# Patient Record
Sex: Female | Born: 1970
Health system: Southern US, Community
[De-identification: ages and names within clinical notes are randomized; demographics above are authoritative.]

## PROBLEM LIST (undated history)

## (undated) DIAGNOSIS — M1990 Unspecified osteoarthritis, unspecified site: Secondary | ICD-10-CM

## (undated) DIAGNOSIS — B029 Zoster without complications: Secondary | ICD-10-CM

## (undated) DIAGNOSIS — I341 Nonrheumatic mitral (valve) prolapse: Secondary | ICD-10-CM

## (undated) DIAGNOSIS — F429 Obsessive-compulsive disorder, unspecified: Secondary | ICD-10-CM

## (undated) DIAGNOSIS — R011 Cardiac murmur, unspecified: Secondary | ICD-10-CM

## (undated) DIAGNOSIS — N3010 Interstitial cystitis (chronic) without hematuria: Secondary | ICD-10-CM

## (undated) HISTORY — DX: Obsessive-compulsive disorder, unspecified: F42.9

## (undated) HISTORY — DX: Interstitial cystitis (chronic) without hematuria: N30.10

## (undated) HISTORY — DX: Cardiac murmur, unspecified: R01.1

## (undated) HISTORY — DX: Zoster without complications: B02.9

## (undated) HISTORY — DX: Nonrheumatic mitral (valve) prolapse: I34.1

## (undated) HISTORY — DX: Unspecified osteoarthritis, unspecified site: M19.90

---
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
## 2001-09-28 ENCOUNTER — Other Ambulatory Visit: Admission: RE | Admit: 2001-09-28 | Discharge: 2001-09-28 | Payer: Self-pay | Admitting: Obstetrics and Gynecology

## 2002-01-06 HISTORY — PX: DILATION AND CURETTAGE OF UTERUS: SHX78

## 2002-04-20 ENCOUNTER — Inpatient Hospital Stay (HOSPITAL_COMMUNITY): Admission: AD | Admit: 2002-04-20 | Discharge: 2002-04-23 | Payer: Self-pay | Admitting: Obstetrics and Gynecology

## 2002-05-20 ENCOUNTER — Other Ambulatory Visit: Admission: RE | Admit: 2002-05-20 | Discharge: 2002-05-20 | Payer: Self-pay | Admitting: Obstetrics and Gynecology

## 2007-11-10 ENCOUNTER — Ambulatory Visit: Payer: Self-pay | Admitting: Urology

## 2014-11-28 ENCOUNTER — Encounter: Payer: Self-pay | Admitting: Urology

## 2014-11-28 ENCOUNTER — Ambulatory Visit (INDEPENDENT_AMBULATORY_CARE_PROVIDER_SITE_OTHER): Payer: BLUE CROSS/BLUE SHIELD | Admitting: Urology

## 2014-11-28 VITALS — BP 126/73 | HR 80 | Ht 63.0 in | Wt 131.2 lb

## 2014-11-28 DIAGNOSIS — R3915 Urgency of urination: Secondary | ICD-10-CM | POA: Insufficient documentation

## 2014-11-28 DIAGNOSIS — R300 Dysuria: Secondary | ICD-10-CM | POA: Diagnosis not present

## 2014-11-28 DIAGNOSIS — R350 Frequency of micturition: Secondary | ICD-10-CM | POA: Diagnosis not present

## 2014-11-28 LAB — URINALYSIS, COMPLETE
Bilirubin, UA: NEGATIVE
GLUCOSE, UA: NEGATIVE
Ketones, UA: NEGATIVE
Leukocytes, UA: NEGATIVE
NITRITE UA: NEGATIVE
PROTEIN UA: NEGATIVE
Specific Gravity, UA: 1.01 (ref 1.005–1.030)
Urobilinogen, Ur: 0.2 mg/dL (ref 0.2–1.0)
pH, UA: 7 (ref 5.0–7.5)

## 2014-11-28 LAB — MICROSCOPIC EXAMINATION
BACTERIA UA: NONE SEEN
RBC, UA: NONE SEEN /hpf (ref 0–?)

## 2014-11-28 LAB — BLADDER SCAN AMB NON-IMAGING

## 2014-11-28 MED ORDER — FESOTERODINE FUMARATE ER 4 MG PO TB24: 4.0000 mg | ORAL_TABLET | Freq: Every day | ORAL | Status: DC

## 2014-11-28 NOTE — Progress Notes (Signed)
11/28/2014 11:58 AM   Allison Lambert Jan 14, 1970 AS:[id number]  Referring provider: No referring provider defined for this encounter.  Chief Complaint  Patient presents with  . Urinary Frequency    New Patient    HPI:  1 - Episodic Dysuria, Likely Interstitial Cystitis - pt with few flairs of irritative symptoms with dysuria / urgency / frequency (up to 2X hourly). One episode lasting few most in 1990s, had eval with IVP, cysto, and told likely interstitial cystitis. Another flair around 11-Oct-2014 after very stressful death in family. UA / UCX negative. No help with empiric ABX. Some significant relief with AZO. Non-smoker. No gross hematuria. No new neurologic symptoms. Pelvic 11/2014 unremarkable. PVR 11/2014 "66mL".   PMH sig for MVP (asymptomatic). No blood thinners.   Today "Allison Lambert" is seen as new patient for above.   PMH: Past Medical History  Diagnosis Date  . Arthritis   . Mitral valve prolapse   . Heart murmur   . OCD (obsessive compulsive disorder)     Surgical History: Past Surgical History  Procedure Laterality Date  . Knee arthroscopy  1987  . Dilation and curettage of uterus  2004    Home Medications:    Medication List       This list is accurate as of: 11/28/14 11:58 AM.  Always use your most recent med list.               cetirizine 10 MG tablet  Commonly known as:  ZYRTEC  Take 10 mg by mouth daily.     omega-3 acid ethyl esters 1 G capsule  Commonly known as:  LOVAZA  Take by mouth 2 (two) times daily.     Vitamin D (Cholecalciferol) 1000 UNITS Caps  Take by mouth.        Allergies: No Known Allergies  Family History: Family History  Problem Relation Age of Onset  . Prostate cancer Father   . Bladder Cancer Neg Hx   . Kidney cancer Neg Hx     Social History:  reports that she has never smoked. She does not have any smokeless tobacco history on file. She reports that she does not drink alcohol or use illicit  drugs.  ROS: UROLOGY Frequent Urination?: Yes Hard to postpone urination?: No Burning/pain with urination?: Yes Get up at night to urinate?: Yes Leakage of urine?: Yes Urine stream starts and stops?: No Trouble starting stream?: No Do you have to strain to urinate?: No Blood in urine?: No Urinary tract infection?: No Sexually transmitted disease?: No Injury to kidneys or bladder?: No Painful intercourse?: Yes Weak stream?: No Currently pregnant?: No Vaginal bleeding?: No Last menstrual period?: 11-11-14  Gastrointestinal Nausea?: No Vomiting?: No Indigestion/heartburn?: Yes Diarrhea?: No Constipation?: No  Constitutional Fever: No Night sweats?: No Weight loss?: No Fatigue?: No  Skin Skin rash/lesions?: No Itching?: No  Eyes Blurred vision?: Yes Double vision?: No  Ears/Nose/Throat Sore throat?: No Sinus problems?: No  Hematologic/Lymphatic Swollen glands?: No Easy bruising?: No  Cardiovascular Leg swelling?: No Chest pain?: No  Respiratory Cough?: No Shortness of breath?: No  Endocrine Excessive thirst?: No  Musculoskeletal Back pain?: No Joint pain?: Yes  Neurological Headaches?: No Dizziness?: No  Psychologic Depression?: No Anxiety?: No  Physical Exam: BP 126/73 mmHg  Pulse 80  Ht 5\' 3"  (1.6 m)  Wt 131 lb 3.2 oz (59.512 kg)  BMI 23.25 kg/m2  LMP 11/11/2014  Constitutional:  Alert and oriented, No acute distress. HEENT: [place] AT, moist mucus membranes.  Trachea midline, no masses. Cardiovascular: No clubbing, cyanosis, or edema. Respiratory: Normal respiratory effort, no increased work of breathing. GI: Abdomen is soft, nontender, ondistended, no abdominal masses GU: No CVA tenderness. PELVIC: With Judson Roch as chaparone pelvic exam performed. No point tenderness. No palpable pelvic masses. No CMT. Bladder and urethra non-painful to palpation.  Skin: No rashes, bruises or suspicious lesions. Lymph: No cervical or inguinal  adenopathy. Neurologic: Grossly intact, no focal deficits, moving all 4 extremities. Psychiatric: Normal mood and affect.  Laboratory Data: No results found for: WBC, HGB, HCT, MCV, PLT  No results found for: CREATININE  No results found for: PSA  No results found for: TESTOSTERONE  No results found for: HGBA1C  Urinalysis No results found for: COLORURINE, APPEARANCEUR, LABSPEC, PHURINE, GLUCOSEU, HGBUR, BILIRUBINUR, KETONESUR, PROTEINUR, UROBILINOGEN, NITRITE, LEUKOCYTESUR  Pertinent Imaging:   Assessment & Plan:    1 - Episodic Dysuria, Likely Interstitial Cystitis - history and exam very consistent with likely interstitial cystitis, mild. Rec symptomatic treatment with anticholinergic and prn AZO and this will likely resolve as it has before when stressors are less. Strongly warned re-evaluation necessary if any fevers, blood in urine, or acute flank pain. Samples and RX for Toviaz 4mg . Warned about dry mouth / constipation.   2 - Offered f/u 3 mos v. Prn and she opts for prn. At this point I feel this is reasonable. She will call if symptoms do not improve or if above symptoms develop.    No Follow-up on file.  Alexis Frock, [facility] [address] [place], [place] [address] [phone]

## 2014-12-05 ENCOUNTER — Telehealth: Payer: Self-pay

## 2014-12-05 MED ORDER — MIRABEGRON ER 50 MG PO TB24: 50.0000 mg | ORAL_TABLET | Freq: Every day | ORAL | Status: DC

## 2014-12-05 NOTE — Telephone Encounter (Signed)
Yes that sounds fine.  Will send script to pharmacy. Please let patient know.   Hollice Espy, MD

## 2014-12-05 NOTE — Telephone Encounter (Signed)
Pt called stating she was needing a PA done for her Allison Lambert, but her pharmacist suggested myrbetriq. Pt requested to try myrbetriq. Please advise.

## 2014-12-05 NOTE — Telephone Encounter (Signed)
Spoke with pt and made aware medication has been called into pharmacy. Pt voiced understanding.

## 2014-12-18 ENCOUNTER — Telehealth: Payer: Self-pay | Admitting: Urology

## 2014-12-18 NOTE — Telephone Encounter (Signed)
Spoke with pt in reference to medication. Pt would prefer to take Toviaz. Pt is going to have pharmacy send over another PA for [place].

## 2014-12-18 NOTE — Telephone Encounter (Signed)
Pt called, stated that she tried the Myrbetriq but it didn't seem to work for her.  She tried the [place] first and it had worked but then because she tried the [employer] but she didn't have as much luck with that one.  Pt wanted to move forward witht he authorization of the Toviaz to fill the prescription.  Pt asked if the nurse could go ahead and do this.  Please call patient to let her know when the authorization has been completed at [phone].

## 2015-07-24 ENCOUNTER — Other Ambulatory Visit: Payer: Self-pay | Admitting: Obstetrics and Gynecology

## 2015-07-24 DIAGNOSIS — Z1231 Encounter for screening mammogram for malignant neoplasm of breast: Secondary | ICD-10-CM

## 2015-08-21 ENCOUNTER — Ambulatory Visit
Admission: RE | Admit: 2015-08-21 | Discharge: 2015-08-21 | Disposition: A | Discharge status: Home / Self Care | Payer: BLUE CROSS/BLUE SHIELD | Source: Ambulatory Visit | Attending: Obstetrics and Gynecology | Admitting: Obstetrics and Gynecology

## 2015-08-21 ENCOUNTER — Other Ambulatory Visit: Payer: Self-pay | Admitting: Obstetrics and Gynecology

## 2015-08-21 DIAGNOSIS — Z1231 Encounter for screening mammogram for malignant neoplasm of breast: Secondary | ICD-10-CM

## 2015-08-23 DIAGNOSIS — Z0000 Encounter for general adult medical examination without abnormal findings: Secondary | ICD-10-CM | POA: Diagnosis not present

## 2015-08-23 DIAGNOSIS — Z124 Encounter for screening for malignant neoplasm of cervix: Secondary | ICD-10-CM | POA: Diagnosis not present

## 2015-08-23 DIAGNOSIS — Z1151 Encounter for screening for human papillomavirus (HPV): Secondary | ICD-10-CM | POA: Diagnosis not present

## 2015-08-23 DIAGNOSIS — Z01419 Encounter for gynecological examination (general) (routine) without abnormal findings: Secondary | ICD-10-CM | POA: Diagnosis not present

## 2015-08-23 DIAGNOSIS — Z3009 Encounter for other general counseling and advice on contraception: Secondary | ICD-10-CM | POA: Diagnosis not present

## 2016-07-15 ENCOUNTER — Other Ambulatory Visit: Payer: Self-pay | Admitting: Obstetrics and Gynecology

## 2016-07-15 DIAGNOSIS — Z1231 Encounter for screening mammogram for malignant neoplasm of breast: Secondary | ICD-10-CM

## 2016-08-28 ENCOUNTER — Ambulatory Visit
Admission: RE | Admit: 2016-08-28 | Discharge: 2016-08-28 | Disposition: A | Discharge status: Home / Self Care | Payer: BLUE CROSS/BLUE SHIELD | Source: Ambulatory Visit | Attending: Obstetrics and Gynecology | Admitting: Obstetrics and Gynecology

## 2016-08-28 ENCOUNTER — Encounter: Payer: Self-pay | Admitting: Obstetrics and Gynecology

## 2016-08-28 DIAGNOSIS — Z1231 Encounter for screening mammogram for malignant neoplasm of breast: Secondary | ICD-10-CM | POA: Insufficient documentation

## 2016-09-16 ENCOUNTER — Ambulatory Visit (INDEPENDENT_AMBULATORY_CARE_PROVIDER_SITE_OTHER): Payer: BLUE CROSS/BLUE SHIELD | Admitting: Obstetrics and Gynecology

## 2016-09-16 ENCOUNTER — Encounter: Payer: Self-pay | Admitting: Obstetrics and Gynecology

## 2016-09-16 VITALS — BP 100/60 | HR 66 | Ht 63.0 in | Wt 122.0 lb

## 2016-09-16 DIAGNOSIS — Z0000 Encounter for general adult medical examination without abnormal findings: Secondary | ICD-10-CM | POA: Diagnosis not present

## 2016-09-16 DIAGNOSIS — Z01419 Encounter for gynecological examination (general) (routine) without abnormal findings: Secondary | ICD-10-CM

## 2016-09-16 DIAGNOSIS — Z131 Encounter for screening for diabetes mellitus: Secondary | ICD-10-CM

## 2016-09-16 DIAGNOSIS — Z13220 Encounter for screening for lipoid disorders: Secondary | ICD-10-CM

## 2016-09-16 DIAGNOSIS — Z1231 Encounter for screening mammogram for malignant neoplasm of breast: Secondary | ICD-10-CM | POA: Diagnosis not present

## 2016-09-16 DIAGNOSIS — Z1239 Encounter for other screening for malignant neoplasm of breast: Secondary | ICD-10-CM

## 2016-09-16 MED ORDER — TETANUS-DIPHTH-ACELL PERTUSSIS 5-2.5-18.5 LF-MCG/0.5 IM SUSP
0.5000 mL | Freq: Once | INTRAMUSCULAR | Status: AC
  Administered 2016-09-16: 0.5 mL via INTRAMUSCULAR

## 2016-09-16 NOTE — Progress Notes (Signed)
PCP:  Patient, No Pcp Per   Chief Complaint  Patient presents with  . Gynecologic Exam     HPI:      [name] is a 46 y.o. [id number] who LMP was Patient's last menstrual period was 08/21/2016., presents today for her annual examination.  Her menses are regular every 28-30 days, lasting 4 days.  Dysmenorrhea mild, occurring first 1-2 days of flow. She does not have intermenstrual bleeding.  Sex activity: single partner, contraception - condoms most of the time and rhythm method.  Last Pap: August 23, 2015  Results were: no abnormalities /neg HPV DNA  Hx of STDs: none  Last mammogram: 08/28/16  Results were: normal--routine follow-up in 12 months There is no FH of breast cancer. There is no FH of ovarian cancer. The patient does do self-breast exams.  Tobacco use: The patient denies current or previous tobacco use. Alcohol use: social drinker No drug use.  Exercise: moderately active  She does get adequate calcium and Vitamin D in her diet.  She had borderline lipids last yr. Normal HgA1C last yr but pre-DM 2016.  She would like a TdAP since last done 10 yrs ago.  She has a hx of OA/frequent UTIs. She was seen by urology and given myrbetriq, but it wasn't covered on insurance. Pt is using pumpkin seed oil with sx control.    Past Medical History:  Diagnosis Date  . Arthritis   . Heart murmur   . Mitral valve prolapse   . OCD (obsessive compulsive disorder)     Past Surgical History:  Procedure Laterality Date  . DILATION AND CURETTAGE OF UTERUS  2004  . KNEE ARTHROSCOPY  1987    Family History  Problem Relation Age of Onset  . Prostate cancer Father   . Bladder Cancer Neg Hx   . Kidney cancer Neg Hx   . Breast cancer Neg Hx     Social History   Social History  . Marital status: Married    Spouse name: N/A  . Number of children: N/A  . Years of education: N/A   Occupational History  . Not on file.   Social History Main Topics  . Smoking  status: Never Smoker  . Smokeless tobacco: Never Used  . Alcohol use No  . Drug use: No  . Sexual activity: Yes    Birth control/ protection: Condom   Other Topics Concern  . Not on file   Social History Narrative  . No narrative on file    No outpatient prescriptions have been marked as taking for the 09/16/16 encounter (Office Visit) with Copland, [name], PA-C.     ROS:  Review of Systems  Constitutional: Negative for fatigue, fever and unexpected weight change.  Respiratory: Negative for cough, shortness of breath and wheezing.   Cardiovascular: Negative for chest pain, palpitations and leg swelling.  Gastrointestinal: Negative for blood in stool, constipation, diarrhea, nausea and vomiting.  Endocrine: Negative for cold intolerance, heat intolerance and polyuria.  Genitourinary: Positive for frequency. Negative for dyspareunia, dysuria, flank pain, genital sores, hematuria, menstrual problem, pelvic pain, urgency, vaginal bleeding, vaginal discharge and vaginal pain.  Musculoskeletal: Negative for back pain, joint swelling and myalgias.  Skin: Negative for rash.  Neurological: Negative for dizziness, syncope, light-headedness, numbness and headaches.  Hematological: Negative for adenopathy.  Psychiatric/Behavioral: Negative for agitation, confusion, sleep disturbance and suicidal ideas. The patient is not nervous/anxious.      Objective: BP 100/60   Pulse 66  Ht 5\' 3"  (1.6 m)   Wt 122 lb (55.3 kg)   LMP 08/21/2016   BMI 21.61 kg/m    Physical Exam  Constitutional: She is oriented to person, place, and time. She appears well-developed and well-nourished.  Genitourinary: Vagina normal and uterus normal. There is no rash or tenderness on the right labia. There is no rash or tenderness on the left labia. No erythema or tenderness in the vagina. No vaginal discharge found. Right adnexum does not display mass and does not display tenderness. Left adnexum does not  display mass and does not display tenderness. Cervix does not exhibit motion tenderness or polyp. Uterus is not enlarged or tender.  Neck: Normal range of motion. No thyromegaly present.  Cardiovascular: Normal rate, regular rhythm and normal heart sounds.   No murmur heard. Pulmonary/Chest: Effort normal and breath sounds normal. Right breast exhibits no mass, no nipple discharge, no skin change and no tenderness. Left breast exhibits no mass, no nipple discharge, no skin change and no tenderness.  Abdominal: Soft. There is no tenderness. There is no guarding.  Musculoskeletal: Normal range of motion.  Neurological: She is alert and oriented to person, place, and time. No cranial nerve deficit.  Psychiatric: She has a normal mood and affect. Her behavior is normal.  Vitals reviewed.   Assessment/Plan: Encounter for annual routine gynecological examination  Screening for breast cancer - Plan: MM DIGITAL SCREENING BILATERAL  Blood tests for routine general physical examination - Plan: Lipid panel, Comprehensive metabolic panel, Hemoglobin A1c  Screening cholesterol level - Plan: Lipid panel  Screening for diabetes mellitus - Plan: Hemoglobin A1c   GYN counsel breast self exam, mammography screening, adequate intake of calcium and vitamin D, diet and exercise     F/U  Return in about 1 year (around 09/16/2017).  Alicia B. Copland, PA-C 09/16/2016 8:58 AM

## 2016-09-16 NOTE — Addendum Note (Signed)
Addended by: Quintella Baton D on: 09/16/2016 09:29 AM   Modules accepted: Orders

## 2016-09-18 LAB — COMPREHENSIVE METABOLIC PANEL

## 2016-09-18 LAB — LIPID PANEL

## 2016-09-30 ENCOUNTER — Encounter: Payer: Self-pay | Admitting: Obstetrics and Gynecology

## 2016-10-09 ENCOUNTER — Other Ambulatory Visit: Payer: Self-pay | Admitting: Obstetrics and Gynecology

## 2016-10-09 ENCOUNTER — Other Ambulatory Visit: Payer: BLUE CROSS/BLUE SHIELD

## 2016-10-09 DIAGNOSIS — Z131 Encounter for screening for diabetes mellitus: Secondary | ICD-10-CM

## 2016-10-09 DIAGNOSIS — Z0000 Encounter for general adult medical examination without abnormal findings: Secondary | ICD-10-CM | POA: Diagnosis not present

## 2016-10-09 DIAGNOSIS — Z13220 Encounter for screening for lipoid disorders: Secondary | ICD-10-CM

## 2016-10-10 LAB — COMPREHENSIVE METABOLIC PANEL
A/G RATIO: 1.7 (ref 1.2–2.2)
ALBUMIN: 4.5 g/dL (ref 3.5–5.5)
ALT: 10 IU/L (ref 0–32)
AST: 17 IU/L (ref 0–40)
Alkaline Phosphatase: 49 IU/L (ref 39–117)
BUN/Creatinine Ratio: 24 — ABNORMAL HIGH (ref 9–23)
BUN: 17 mg/dL (ref 6–24)
Bilirubin Total: 0.4 mg/dL (ref 0.0–1.2)
CALCIUM: 9.4 mg/dL (ref 8.7–10.2)
CO2: 19 mmol/L — AB (ref 20–29)
CREATININE: 0.7 mg/dL (ref 0.57–1.00)
Chloride: 103 mmol/L (ref 96–106)
GFR, EST AFRICAN AMERICAN: 119 mL/min/{1.73_m2} (ref >59)
GFR, EST NON AFRICAN AMERICAN: 104 mL/min/{1.73_m2} (ref >59)
GLOBULIN, TOTAL: 2.6 g/dL (ref 1.5–4.5)
Glucose: 83 mg/dL (ref 65–99)
Potassium: 4.9 mmol/L (ref 3.5–5.2)
SODIUM: 139 mmol/L (ref 134–144)
TOTAL PROTEIN: 7.1 g/dL (ref 6.0–8.5)

## 2016-10-10 LAB — LIPID PANEL
CHOL/HDL RATIO: 4 ratio (ref 0.0–4.4)
Cholesterol, Total: 261 mg/dL — ABNORMAL HIGH (ref 100–199)
HDL: 66 mg/dL (ref >39)
LDL CALC: 184 mg/dL — AB (ref 0–99)
TRIGLYCERIDES: 57 mg/dL (ref 0–149)
VLDL Cholesterol Cal: 11 mg/dL (ref 5–40)

## 2016-10-10 LAB — HEMOGLOBIN A1C
Est. average glucose Bld gHb Est-mCnc: 103 mg/dL
HEMOGLOBIN A1C: 5.2 % (ref 4.8–5.6)

## 2016-10-13 ENCOUNTER — Telehealth: Payer: Self-pay | Admitting: Obstetrics and Gynecology

## 2016-10-13 DIAGNOSIS — E785 Hyperlipidemia, unspecified: Secondary | ICD-10-CM | POA: Insufficient documentation

## 2016-10-13 DIAGNOSIS — E782 Mixed hyperlipidemia: Secondary | ICD-10-CM

## 2016-10-13 MED ORDER — ATORVASTATIN CALCIUM 10 MG PO TABS: 10.0000 mg | ORAL_TABLET | Freq: Every day | ORAL | 2 refills | Status: DC

## 2016-10-13 NOTE — Telephone Encounter (Signed)
Pt aware of elevated lipids, increased from last yr. Has done diet/wt loss/exercise changes with worsening levels. No FH. Pt amenable to trying low dose statin. Rx lipitor 10 mg. Rechk labs in 3 months.

## 2016-12-23 ENCOUNTER — Other Ambulatory Visit: Payer: Self-pay | Admitting: Obstetrics and Gynecology

## 2017-01-08 ENCOUNTER — Other Ambulatory Visit: Payer: BLUE CROSS/BLUE SHIELD

## 2017-01-08 DIAGNOSIS — E782 Mixed hyperlipidemia: Secondary | ICD-10-CM | POA: Diagnosis not present

## 2017-01-09 LAB — COMPREHENSIVE METABOLIC PANEL
ALBUMIN: 4.4 g/dL (ref 3.5–5.5)
ALT: 16 IU/L (ref 0–32)
AST: 16 IU/L (ref 0–40)
Albumin/Globulin Ratio: 1.8 (ref 1.2–2.2)
Alkaline Phosphatase: 49 IU/L (ref 39–117)
BILIRUBIN TOTAL: 0.3 mg/dL (ref 0.0–1.2)
BUN / CREAT RATIO: 25 — AB (ref 9–23)
BUN: 16 mg/dL (ref 6–24)
CHLORIDE: 101 mmol/L (ref 96–106)
CO2: 22 mmol/L (ref 20–29)
CREATININE: 0.64 mg/dL (ref 0.57–1.00)
Calcium: 9.6 mg/dL (ref 8.7–10.2)
GFR calc non Af Amer: 107 mL/min/{1.73_m2} (ref >59)
GFR, EST AFRICAN AMERICAN: 123 mL/min/{1.73_m2} (ref >59)
GLUCOSE: 85 mg/dL (ref 65–99)
Globulin, Total: 2.4 g/dL (ref 1.5–4.5)
Potassium: 5.2 mmol/L (ref 3.5–5.2)
Sodium: 139 mmol/L (ref 134–144)
TOTAL PROTEIN: 6.8 g/dL (ref 6.0–8.5)

## 2017-01-09 LAB — LIPID PANEL
Chol/HDL Ratio: 2.3 ratio (ref 0.0–4.4)
Cholesterol, Total: 149 mg/dL (ref 100–199)
HDL: 66 mg/dL (ref >39)
LDL Calculated: 72 mg/dL (ref 0–99)
Triglycerides: 54 mg/dL (ref 0–149)
VLDL Cholesterol Cal: 11 mg/dL (ref 5–40)

## 2017-01-12 ENCOUNTER — Telehealth: Payer: Self-pay | Admitting: Obstetrics and Gynecology

## 2017-01-12 MED ORDER — ATORVASTATIN CALCIUM 10 MG PO TABS: 10.0000 mg | ORAL_TABLET | Freq: Every day | ORAL | 2 refills | Status: DC

## 2017-01-12 NOTE — Telephone Encounter (Signed)
Pt aware of improved lipids on lipitor 10 mg. No side effects. Pt wants to continue. Rx eRxd. Rechk at 9/19 annual.

## 2017-04-14 DIAGNOSIS — R21 Rash and other nonspecific skin eruption: Secondary | ICD-10-CM | POA: Diagnosis not present

## 2017-05-25 ENCOUNTER — Encounter: Payer: Self-pay | Admitting: Obstetrics and Gynecology

## 2017-06-05 ENCOUNTER — Encounter: Payer: Self-pay | Admitting: Obstetrics and Gynecology

## 2017-06-09 NOTE — Telephone Encounter (Signed)
Called and left voice mail for patient to call back to be schedule °

## 2017-06-10 ENCOUNTER — Ambulatory Visit (INDEPENDENT_AMBULATORY_CARE_PROVIDER_SITE_OTHER): Payer: BLUE CROSS/BLUE SHIELD | Admitting: Obstetrics and Gynecology

## 2017-06-10 ENCOUNTER — Encounter: Payer: Self-pay | Admitting: Obstetrics and Gynecology

## 2017-06-10 VITALS — BP 110/68 | HR 87 | Ht 63.0 in | Wt 109.0 lb

## 2017-06-10 DIAGNOSIS — N938 Other specified abnormal uterine and vaginal bleeding: Secondary | ICD-10-CM

## 2017-06-10 DIAGNOSIS — N921 Excessive and frequent menstruation with irregular cycle: Secondary | ICD-10-CM | POA: Diagnosis not present

## 2017-06-10 NOTE — Patient Instructions (Signed)
I value your feedback and entrusting us with your care. If you get a [facility] patient survey, I would appreciate you taking the time to let us know about your experience today. Thank you! 

## 2017-06-10 NOTE — Progress Notes (Signed)
Patient, No Pcp Per   Chief Complaint  Patient presents with  . Menstrual Problem    Periods are more frequent and heavier.     HPI:      [name] is a 47 y.o. [id number] who LMP was Patient's last menstrual period was 05/24/2017 (exact date)., presents today for irregular menses the past 3 cycles. Menses were Q26 days, lasting 4 days with 1 heavy day, minimal cramping. Cycles have become shorter the past 2 months and flow is longer (5-7 days) and heavier with increased clots. Having midcycle BTB this month. No change in cramping. No increased stress/reported wt changes (down 13# on our scales since 9/18 but pt was trying to lose wt and hasn't lost any more recently) or travel. Diagnosed with shingles 4/19.  She is sex active, uses NFP/condoms. No pain/bleeding with sex.    Past Medical History:  Diagnosis Date  . Arthritis   . Heart murmur   . Mitral valve prolapse   . OCD (obsessive compulsive disorder)   . Shingles     Past Surgical History:  Procedure Laterality Date  . DILATION AND CURETTAGE OF UTERUS  2004  . KNEE ARTHROSCOPY  1987    Family History  Problem Relation Age of Onset  . Prostate cancer Father   . Bladder Cancer Neg Hx   . Kidney cancer Neg Hx   . Breast cancer Neg Hx     Social History   Socioeconomic History  . Marital status: Married    Spouse name: Not on file  . Number of children: Not on file  . Years of education: Not on file  . Highest education level: Not on file  Occupational History  . Not on file  Social Needs  . Financial resource strain: Not on file  . Food insecurity:    Worry: Not on file    Inability: Not on file  . Transportation needs:    Medical: Not on file    Non-medical: Not on file  Tobacco Use  . Smoking status: Never Smoker  . Smokeless tobacco: Never Used  Substance and Sexual Activity  . Alcohol use: No    Alcohol/week: 0.0 oz  . Drug use: No  . Sexual activity: Yes    Birth control/protection:  Condom  Lifestyle  . Physical activity:    Days per week: Not on file    Minutes per session: Not on file  . Stress: Not on file  Relationships  . Social connections:    Talks on phone: Not on file    Gets together: Not on file    Attends religious service: Not on file    Active member of club or organization: Not on file    Attends meetings of clubs or organizations: Not on file    Relationship status: Not on file  . Intimate partner violence:    Fear of current or ex partner: Not on file    Emotionally abused: Not on file    Physically abused: Not on file    Forced sexual activity: Not on file  Other Topics Concern  . Not on file  Social History Narrative  . Not on file    Outpatient Medications Prior to Visit  Medication Sig Dispense Refill  . atorvastatin (LIPITOR) 10 MG tablet Take 1 tablet (10 mg total) by mouth daily. 90 tablet 2  . Calcium-Magnesium-Vitamin D (CALCIUM 1200+D3 PO)     . cetirizine (ZYRTEC) 10 MG tablet Take 10  mg by mouth daily.    . Magnesium 250 MG TABS     . omega-3 acid ethyl esters (LOVAZA) 1 G capsule Take by mouth 2 (two) times daily.    . Vitamin D, Cholecalciferol, 1000 UNITS CAPS Take by mouth.    . fesoterodine (TOVIAZ) 4 MG TB24 tablet Take 1 tablet (4 mg total) by mouth daily. As needed for urinary urgency / freqeuncy. 30 tablet 11  . mirabegron ER (MYRBETRIQ) 50 MG TB24 tablet Take 1 tablet (50 mg total) by mouth daily. 30 tablet 5   No facility-administered medications prior to visit.     ROS:  Review of Systems  Constitutional: Negative for fever.  Gastrointestinal: Negative for blood in stool, constipation, diarrhea, nausea and vomiting.  Genitourinary: Positive for menstrual problem. Negative for dyspareunia, dysuria, flank pain, frequency, hematuria, urgency, vaginal bleeding, vaginal discharge and vaginal pain.  Musculoskeletal: Negative for back pain.  Skin: Negative for rash.    OBJECTIVE:   Vitals:  BP 110/68   Pulse  87   Ht 5\' 3"  (1.6 m)   Wt 109 lb (49.4 kg)   LMP 05/24/2017 (Exact Date)   BMI 19.31 kg/m   Physical Exam  Constitutional: She is oriented to person, place, and time. Vital signs are normal. She appears well-developed.  Pulmonary/Chest: Effort normal.  Genitourinary: Vagina normal and uterus normal. There is no rash, tenderness or lesion on the right labia. There is no rash, tenderness or lesion on the left labia. Uterus is not enlarged and not tender. Cervix exhibits no motion tenderness. Right adnexum displays no mass and no tenderness. Left adnexum displays no mass and no tenderness. No erythema or tenderness in the vagina. No vaginal discharge found.  Musculoskeletal: Normal range of motion.  Neurological: She is alert and oriented to person, place, and time.  Psychiatric: She has a normal mood and affect. Her behavior is normal. Thought content normal.  Vitals reviewed.    Assessment/Plan: DUB (dysfunctional uterine bleeding) - CHeck labs. If WNL, will check u/s. Neg exam today.  - Plan: TSH, Prolactin, POCT urine pregnancy  Menorrhagia with irregular cycle - Plan: TSH, Prolactin    Return in about 1 day (around 06/11/2017) for labs.  Alicia B. Copland, PA-C 06/11/2017 2:49 PM

## 2017-06-11 ENCOUNTER — Other Ambulatory Visit: Payer: BLUE CROSS/BLUE SHIELD

## 2017-06-11 ENCOUNTER — Encounter: Payer: Self-pay | Admitting: Obstetrics and Gynecology

## 2017-06-11 DIAGNOSIS — N938 Other specified abnormal uterine and vaginal bleeding: Secondary | ICD-10-CM | POA: Diagnosis not present

## 2017-06-11 DIAGNOSIS — N921 Excessive and frequent menstruation with irregular cycle: Secondary | ICD-10-CM | POA: Diagnosis not present

## 2017-06-11 LAB — POCT URINE PREGNANCY: Preg Test, Ur: NEGATIVE

## 2017-06-12 LAB — TSH: TSH: 1.46 u[IU]/mL (ref 0.450–4.500)

## 2017-06-12 LAB — PROLACTIN: Prolactin: 24.6 ng/mL — ABNORMAL HIGH (ref 4.8–23.3)

## 2017-06-15 ENCOUNTER — Telehealth: Payer: Self-pay | Admitting: Obstetrics and Gynecology

## 2017-06-15 DIAGNOSIS — N938 Other specified abnormal uterine and vaginal bleeding: Secondary | ICD-10-CM

## 2017-06-15 NOTE — Telephone Encounter (Signed)
Pt's labs neg for DUB. RTO day 5-9 of cycle for GYN u/s. Pt's menses started 06/13/17 (20 days this month), regular flow/cramping. Will call with u/s results and dispo. If neg, can discuss [facility] options/IUD/watch and wait.

## 2017-06-17 ENCOUNTER — Ambulatory Visit (INDEPENDENT_AMBULATORY_CARE_PROVIDER_SITE_OTHER): Payer: BLUE CROSS/BLUE SHIELD

## 2017-06-17 DIAGNOSIS — N938 Other specified abnormal uterine and vaginal bleeding: Secondary | ICD-10-CM | POA: Diagnosis not present

## 2017-06-18 ENCOUNTER — Telehealth: Payer: Self-pay | Admitting: Obstetrics and Gynecology

## 2017-06-18 NOTE — Telephone Encounter (Signed)
Pt aware of neg GYN u/s results for DUB. EM=4.78 mm. Pt menses Q19-26 days. This period was 19 days but normal flow/length of bleeding. Had neg labs. Discussed BC options, ablation, vs follow expectantly. Pt wants to watch cycles a few more months. F/u at 9/19 annual/sooner prn.  Also has small LTO cyst vs paraovarian cyst. Rechk u/s at 9/19 annual.   Patient Name: Allison Lambert DOB: [birth year] MRN: [id number]  ULTRASOUND REPORT  Location: [facility]  Date of Service: 06/17/2017    Indications:AUB Findings:  The uterus is anteverted and measures 10.03x5.96x4.12cm. Echo texture is homogenous without evidence of focal masses. The Endometrium measures 4.78 mm.  Right Ovary measures 2,38x2.45x1.46 cm. It is normal in appearance. Left Ovary measures 2.96x2.44x2.41 cm. It is normal in appearance. Survey of the adnexa demonstrates  adnexal mass or a simple cyst = 1.87x1.31cm appears next to the Left ovary (possib. Of para-ovarian cyst) There is no free fluid in the cul de sac.  Impression: 1.  simple cyst = 1.87x1.31cm appears next to the Left ovary (possib. Of para-ovarian cyst)  Recommendations: 1.Clinical correlation with the patient's History and Physical Exam.  Ninfa Linden RDMS

## 2017-09-01 ENCOUNTER — Ambulatory Visit
Admission: RE | Admit: 2017-09-01 | Discharge: 2017-09-01 | Disposition: A | Discharge status: Home / Self Care | Payer: BLUE CROSS/BLUE SHIELD | Source: Ambulatory Visit | Attending: Obstetrics and Gynecology | Admitting: Obstetrics and Gynecology

## 2017-09-01 ENCOUNTER — Encounter: Payer: Self-pay | Admitting: Obstetrics and Gynecology

## 2017-09-01 DIAGNOSIS — Z1231 Encounter for screening mammogram for malignant neoplasm of breast: Secondary | ICD-10-CM | POA: Diagnosis not present

## 2017-09-01 DIAGNOSIS — Z1239 Encounter for other screening for malignant neoplasm of breast: Secondary | ICD-10-CM

## 2017-09-21 ENCOUNTER — Other Ambulatory Visit: Payer: Self-pay | Admitting: Obstetrics and Gynecology

## 2017-09-21 ENCOUNTER — Other Ambulatory Visit: Payer: BLUE CROSS/BLUE SHIELD

## 2017-09-21 ENCOUNTER — Ambulatory Visit: Payer: BLUE CROSS/BLUE SHIELD | Admitting: Obstetrics and Gynecology

## 2017-09-21 DIAGNOSIS — N83202 Unspecified ovarian cyst, left side: Secondary | ICD-10-CM

## 2017-09-22 ENCOUNTER — Encounter: Payer: Self-pay | Admitting: Obstetrics and Gynecology

## 2017-09-22 ENCOUNTER — Ambulatory Visit (INDEPENDENT_AMBULATORY_CARE_PROVIDER_SITE_OTHER): Payer: BLUE CROSS/BLUE SHIELD | Admitting: Obstetrics and Gynecology

## 2017-09-22 ENCOUNTER — Ambulatory Visit (INDEPENDENT_AMBULATORY_CARE_PROVIDER_SITE_OTHER): Payer: BLUE CROSS/BLUE SHIELD

## 2017-09-22 VITALS — BP 112/70 | HR 91 | Ht 63.0 in | Wt 110.0 lb

## 2017-09-22 DIAGNOSIS — Z01411 Encounter for gynecological examination (general) (routine) with abnormal findings: Secondary | ICD-10-CM | POA: Diagnosis not present

## 2017-09-22 DIAGNOSIS — Z1239 Encounter for other screening for malignant neoplasm of breast: Secondary | ICD-10-CM

## 2017-09-22 DIAGNOSIS — N83202 Unspecified ovarian cyst, left side: Secondary | ICD-10-CM | POA: Insufficient documentation

## 2017-09-22 DIAGNOSIS — E782 Mixed hyperlipidemia: Secondary | ICD-10-CM | POA: Diagnosis not present

## 2017-09-22 DIAGNOSIS — D251 Intramural leiomyoma of uterus: Secondary | ICD-10-CM

## 2017-09-22 DIAGNOSIS — Z0000 Encounter for general adult medical examination without abnormal findings: Secondary | ICD-10-CM

## 2017-09-22 DIAGNOSIS — Z1231 Encounter for screening mammogram for malignant neoplasm of breast: Secondary | ICD-10-CM | POA: Diagnosis not present

## 2017-09-22 DIAGNOSIS — N938 Other specified abnormal uterine and vaginal bleeding: Secondary | ICD-10-CM | POA: Diagnosis not present

## 2017-09-22 DIAGNOSIS — Z01419 Encounter for gynecological examination (general) (routine) without abnormal findings: Secondary | ICD-10-CM

## 2017-09-22 NOTE — Progress Notes (Signed)
PCP:  Patient, No Pcp Per   Chief Complaint  Patient presents with  . Gynecologic Exam    u/s today     HPI:      [name] is a 47 y.o. [id number] who LMP was Patient's last menstrual period was 09/14/2017 (exact date)., presents today for her annual examination.  Her menses are regular every 21-24 days (used to be Q26 days), lasting 4 days.  Dysmenorrhea mild, improved with NSAIDs. She does not have intermenstrual bleeding. DUB sx from 6/19 improved (cycles were closer to Q2 wks). Had neg eval with labs/u/s. Offered BC for cycle control but pt wanted to follow cycles. Doing ok so far.  Incidental LTO cyst vs paraovarian cyst noted on 6/19 u/s so repeat u/s done today.  Sex activity: single partner, contraception - condoms most of the time and rhythm method.  Last Pap: 08/23/15 Results were: no abnormalities /neg HPV DNA  Hx of STDs: none  Last mammogram: 09/01/17 Results were: normal--routine follow-up in 12 months There is no FH of breast cancer. There is no FH of ovarian cancer. The patient does do self-breast exams.  Tobacco use: The patient denies current or previous tobacco use. Alcohol use: social drinker No drug use.  Exercise: very active  She does get adequate calcium and Vitamin D in her diet.  She had elevated lipids 10/18 and was started on lipitor 10 mg daily. Normal lipids 1/19. Doing well with Rx. No side effects. Due for labs today. Normal HgA1C 10/18 but pre-DM 2016. Wt loss improved levels.   Past Medical History:  Diagnosis Date  . Arthritis   . Heart murmur   . Mitral valve prolapse   . OCD (obsessive compulsive disorder)   . Shingles     Past Surgical History:  Procedure Laterality Date  . DILATION AND CURETTAGE OF UTERUS  2004  . KNEE ARTHROSCOPY  1987    Family History  Problem Relation Age of Onset  . Prostate cancer Father 31  . Bladder Cancer Father 11       Malignant  . Alzheimer's disease Maternal Grandmother   . Diabetes  Mellitus II Maternal Grandmother   . Heart disease Maternal Grandmother 74  . Other Maternal Grandmother        Myocardial infarction old/healed  . Diabetes Mellitus II Maternal Grandfather   . Prostate cancer Paternal Grandfather 33  . Kidney cancer Neg Hx   . Breast cancer Neg Hx     Social History   Socioeconomic History  . Marital status: Married    Spouse name: Not on file  . Number of children: Not on file  . Years of education: Not on file  . Highest education level: Not on file  Occupational History  . Not on file  Social Needs  . Financial resource strain: Not on file  . Food insecurity:    Worry: Not on file    Inability: Not on file  . Transportation needs:    Medical: Not on file    Non-medical: Not on file  Tobacco Use  . Smoking status: Never Smoker  . Smokeless tobacco: Never Used  Substance and Sexual Activity  . Alcohol use: No    Alcohol/week: 0.0 standard drinks  . Drug use: No  . Sexual activity: Yes    Birth control/protection: Condom  Lifestyle  . Physical activity:    Days per week: Not on file    Minutes per session: Not on file  . Stress:  Not on file  Relationships  . Social connections:    Talks on phone: Not on file    Gets together: Not on file    Attends religious service: Not on file    Active member of club or organization: Not on file    Attends meetings of clubs or organizations: Not on file    Relationship status: Not on file  . Intimate partner violence:    Fear of current or ex partner: Not on file    Emotionally abused: Not on file    Physically abused: Not on file    Forced sexual activity: Not on file  Other Topics Concern  . Not on file  Social History Narrative  . Not on file    Current Meds  Medication Sig  . atorvastatin (LIPITOR) 10 MG tablet Take 1 tablet (10 mg total) by mouth daily.  . Calcium-Magnesium-Vitamin D (CALCIUM 1200+D3 PO)   . cetirizine (ZYRTEC) 10 MG tablet Take 10 mg by mouth daily.  .  Magnesium 250 MG TABS   . omega-3 acid ethyl esters (LOVAZA) 1 G capsule Take by mouth 2 (two) times daily.  . Vitamin D, Cholecalciferol, 1000 UNITS CAPS Take by mouth.     ROS:  Review of Systems  Constitutional: Negative for fatigue, fever and unexpected weight change.  Respiratory: Negative for cough, shortness of breath and wheezing.   Cardiovascular: Negative for chest pain, palpitations and leg swelling.  Gastrointestinal: Negative for blood in stool, constipation, diarrhea, nausea and vomiting.  Endocrine: Negative for cold intolerance, heat intolerance and polyuria.  Genitourinary: Positive for frequency. Negative for dyspareunia, dysuria, flank pain, genital sores, hematuria, menstrual problem, pelvic pain, urgency, vaginal bleeding, vaginal discharge and vaginal pain.  Musculoskeletal: Negative for back pain, joint swelling and myalgias.  Skin: Negative for rash.  Neurological: Negative for dizziness, syncope, light-headedness, numbness and headaches.  Hematological: Negative for adenopathy.  Psychiatric/Behavioral: Negative for agitation, confusion, sleep disturbance and suicidal ideas. The patient is not nervous/anxious.      Objective: BP 112/70   Pulse 91   Ht 5\' 3"  (1.6 m)   Wt 110 lb (49.9 kg)   LMP 09/14/2017 (Exact Date)   BMI 19.49 kg/m    Physical Exam  Constitutional: She is oriented to person, place, and time. She appears well-developed and well-nourished.  Genitourinary: Vagina normal and uterus normal. There is no rash or tenderness on the right labia. There is no rash or tenderness on the left labia. No erythema or tenderness in the vagina. No vaginal discharge found. Right adnexum does not display mass and does not display tenderness. Left adnexum does not display mass and does not display tenderness. Cervix does not exhibit motion tenderness or polyp. Uterus is not enlarged or tender.  Neck: Normal range of motion. No thyromegaly present.    Cardiovascular: Normal rate, regular rhythm and normal heart sounds.  No murmur heard. Pulmonary/Chest: Effort normal and breath sounds normal. Right breast exhibits no mass, no nipple discharge, no skin change and no tenderness. Left breast exhibits no mass, no nipple discharge, no skin change and no tenderness.  Abdominal: Soft. There is no tenderness. There is no guarding.  Musculoskeletal: Normal range of motion.  Neurological: She is alert and oriented to person, place, and time. No cranial nerve deficit.  Psychiatric: She has a normal mood and affect. Her behavior is normal.  Vitals reviewed.  RESULTS:  Patient Name: Allison Lambert DOB: 01/16/1970 MRN: [id number]  ULTRASOUND REPORT  Location: [facility]  Date of Service: 09/22/2017    Indications:Left adnexal cyst (para-ovarian) Findings:  The uterus is anteverted and measures 8.96x6.26x4.12cm. Echo texture is heterogenous with evidence of focal masses. Within the uterus there is  suspected fibroid measuring: Fibroid 1:IM, in posterior uterine wall = 11.18x7.30mm  The Endometrium measures 7.84 mm.  Right Ovary measures 2.41x2.05x1.48 cm. It is normal in appearance. Left Ovary measures 3.72x2.43x1.46 cm. It is normal in appearance. Survey of the adnexa demonstrates no adnexal masses. There is no free fluid in the cul de sac.  Impression: 1. Within the uterus there is  suspected fibroid measuring: Fibroid 1:IM, in posterior uterine wall = 11.18x7.108mm 2. No Left adnexal cyst (para-ovarian) is seen    Recommendations: 1.Clinical correlation with the patient's History and Physical Exam.  Ninfa Linden, RDMS   Assessment/Plan: Encounter for annual routine gynecological examination  Screening for breast cancer - Pt current on mammo.  Blood tests for routine general physical examination - Plan: Comprehensive metabolic panel, Lipid panel  Mixed hyperlipidemia - Check labs. Will RF lipitor based on  levels. - Plan: Comprehensive metabolic panel, Lipid panel  DUB (dysfunctional uterine bleeding) - Had neg eval. Sx improved. F/u prn.  Cyst of left ovary - Resolved on u/s today. F/u prn.    GYN counsel breast self exam, mammography screening, adequate intake of calcium and vitamin D, diet and exercise     F/U  Return in about 1 year (around 09/23/2018).  Alicia B. Copland, PA-C 09/22/2017 9:37 AM

## 2017-09-22 NOTE — Patient Instructions (Signed)
I value your feedback and entrusting us with your care. If you get a [facility] patient survey, I would appreciate you taking the time to let us know about your experience today. Thank you! 

## 2017-09-23 ENCOUNTER — Other Ambulatory Visit: Payer: Self-pay | Admitting: Obstetrics and Gynecology

## 2017-09-23 ENCOUNTER — Encounter: Payer: Self-pay | Admitting: Obstetrics and Gynecology

## 2017-09-23 LAB — COMPREHENSIVE METABOLIC PANEL
ALBUMIN: 4.5 g/dL (ref 3.5–5.5)
ALT: 16 IU/L (ref 0–32)
AST: 20 IU/L (ref 0–40)
Albumin/Globulin Ratio: 1.9 (ref 1.2–2.2)
Alkaline Phosphatase: 50 IU/L (ref 39–117)
BILIRUBIN TOTAL: 0.4 mg/dL (ref 0.0–1.2)
BUN / CREAT RATIO: 32 — AB (ref 9–23)
BUN: 20 mg/dL (ref 6–24)
CO2: 24 mmol/L (ref 20–29)
CREATININE: 0.63 mg/dL (ref 0.57–1.00)
Calcium: 9.8 mg/dL (ref 8.7–10.2)
Chloride: 101 mmol/L (ref 96–106)
GFR calc Af Amer: 123 mL/min/{1.73_m2} (ref >59)
GFR calc non Af Amer: 106 mL/min/{1.73_m2} (ref >59)
GLOBULIN, TOTAL: 2.4 g/dL (ref 1.5–4.5)
Glucose: 90 mg/dL (ref 65–99)
Potassium: 4.9 mmol/L (ref 3.5–5.2)
SODIUM: 141 mmol/L (ref 134–144)
Total Protein: 6.9 g/dL (ref 6.0–8.5)

## 2017-09-23 LAB — LIPID PANEL
Chol/HDL Ratio: 2.6 ratio (ref 0.0–4.4)
Cholesterol, Total: 174 mg/dL (ref 100–199)
HDL: 68 mg/dL (ref >39)
LDL CALC: 93 mg/dL (ref 0–99)
Triglycerides: 65 mg/dL (ref 0–149)
VLDL Cholesterol Cal: 13 mg/dL (ref 5–40)

## 2017-09-23 MED ORDER — ATORVASTATIN CALCIUM 10 MG PO TABS: 10.0000 mg | ORAL_TABLET | Freq: Every day | ORAL | 3 refills | Status: DC

## 2017-09-23 NOTE — Progress Notes (Signed)
Rx RF lipitor for 1 yr. Rechk labs 1 yr

## 2018-07-19 ENCOUNTER — Other Ambulatory Visit: Payer: Self-pay | Admitting: Obstetrics and Gynecology

## 2018-07-19 DIAGNOSIS — Z1231 Encounter for screening mammogram for malignant neoplasm of breast: Secondary | ICD-10-CM

## 2018-09-03 ENCOUNTER — Ambulatory Visit
Admission: RE | Admit: 2018-09-03 | Discharge: 2018-09-03 | Disposition: A | Discharge status: Home / Self Care | Payer: BC Managed Care – PPO | Source: Ambulatory Visit | Attending: Obstetrics and Gynecology | Admitting: Obstetrics and Gynecology

## 2018-09-03 DIAGNOSIS — Z1231 Encounter for screening mammogram for malignant neoplasm of breast: Secondary | ICD-10-CM | POA: Insufficient documentation

## 2018-09-06 ENCOUNTER — Other Ambulatory Visit: Payer: Self-pay | Admitting: Obstetrics and Gynecology

## 2018-09-06 DIAGNOSIS — N6489 Other specified disorders of breast: Secondary | ICD-10-CM

## 2018-09-06 DIAGNOSIS — R928 Other abnormal and inconclusive findings on diagnostic imaging of breast: Secondary | ICD-10-CM

## 2018-09-12 ENCOUNTER — Other Ambulatory Visit: Payer: Self-pay | Admitting: Obstetrics and Gynecology

## 2018-09-14 NOTE — Telephone Encounter (Signed)
Please advise, annual 9/22.

## 2018-09-16 ENCOUNTER — Ambulatory Visit
Admission: RE | Admit: 2018-09-16 | Discharge: 2018-09-16 | Disposition: A | Discharge status: Home / Self Care | Payer: BC Managed Care – PPO | Source: Ambulatory Visit | Attending: Obstetrics and Gynecology | Admitting: Obstetrics and Gynecology

## 2018-09-16 ENCOUNTER — Encounter: Payer: Self-pay | Admitting: Obstetrics and Gynecology

## 2018-09-16 DIAGNOSIS — R928 Other abnormal and inconclusive findings on diagnostic imaging of breast: Secondary | ICD-10-CM | POA: Diagnosis not present

## 2018-09-16 DIAGNOSIS — N6489 Other specified disorders of breast: Secondary | ICD-10-CM

## 2018-09-16 DIAGNOSIS — R922 Inconclusive mammogram: Secondary | ICD-10-CM | POA: Diagnosis not present

## 2018-09-28 ENCOUNTER — Ambulatory Visit (INDEPENDENT_AMBULATORY_CARE_PROVIDER_SITE_OTHER): Payer: BC Managed Care – PPO | Admitting: Obstetrics and Gynecology

## 2018-09-28 ENCOUNTER — Other Ambulatory Visit: Payer: Self-pay

## 2018-09-28 ENCOUNTER — Encounter: Payer: Self-pay | Admitting: Obstetrics and Gynecology

## 2018-09-28 VITALS — BP 120/72 | Ht 63.0 in | Wt 113.0 lb

## 2018-09-28 DIAGNOSIS — Z23 Encounter for immunization: Secondary | ICD-10-CM

## 2018-09-28 DIAGNOSIS — Z0000 Encounter for general adult medical examination without abnormal findings: Secondary | ICD-10-CM

## 2018-09-28 DIAGNOSIS — E782 Mixed hyperlipidemia: Secondary | ICD-10-CM

## 2018-09-28 DIAGNOSIS — Z01419 Encounter for gynecological examination (general) (routine) without abnormal findings: Secondary | ICD-10-CM

## 2018-09-28 DIAGNOSIS — Z1239 Encounter for other screening for malignant neoplasm of breast: Secondary | ICD-10-CM

## 2018-09-28 NOTE — Patient Instructions (Signed)
I value your feedback and entrusting us with your care. If you get a [facility] patient survey, I would appreciate you taking the time to let us know about your experience today. Thank you! 

## 2018-09-28 NOTE — Progress Notes (Signed)
PCP:  Patient, No Pcp Per   Chief Complaint  Patient presents with  . Gynecologic Exam  . Immunizations    flu shot today     HPI:      [name] is a 48 y.o. [id number] who LMP was Patient's last menstrual period was 09/21/2018 (exact date)., presents today for her annual examination.  Her menses are regular every 24-25 days, lasting 4 days. Has n/v and hot flashes first day of menses, but had similar sx in adolescence. No vasomotor sx otherwise. Dysmenorrhea mild, improved with NSAIDs. She does not have intermenstrual bleeding. DUB sx from 6/19 improved (cycles were closer to Q2 wks). Had neg eval with labs/u/s.  Sex activity: single partner, contraception - condoms most of the time and rhythm method.  Last Pap: 08/23/15 Results were: no abnormalities /neg HPV DNA  Hx of STDs: none  Last mammogram: 09/16/18 Results were: normal--routine follow-up in 12 months There is no FH of breast cancer. There is no FH of ovarian cancer. The patient does do self-breast exams.  Tobacco use: The patient denies current or previous tobacco use. Alcohol use: social drinker No drug use.  Exercise: very active  She does get adequate calcium and Vitamin D in her diet.  She had elevated lipids 10/18 and was started on lipitor 10 mg daily.  No side effects. Due for labs. Normal HgA1C 10/18 but pre-DM 2016. Wt loss improved levels.  Having issues with tennis elbow.   Past Medical History:  Diagnosis Date  . Arthritis   . Heart murmur   . Mitral valve prolapse   . OCD (obsessive compulsive disorder)   . Shingles     Past Surgical History:  Procedure Laterality Date  . DILATION AND CURETTAGE OF UTERUS  2004  . KNEE ARTHROSCOPY  1987    Family History  Problem Relation Age of Onset  . Prostate cancer Father 60  . Bladder Cancer Father 49       Malignant  . Alzheimer's disease Maternal Grandmother   . Diabetes Mellitus II Maternal Grandmother   . Heart disease Maternal  Grandmother 74  . Other Maternal Grandmother        Myocardial infarction old/healed  . Diabetes Mellitus II Maternal Grandfather   . Prostate cancer Paternal Grandfather 87  . Kidney cancer Neg Hx   . Breast cancer Neg Hx     Social History   Socioeconomic History  . Marital status: Married    Spouse name: Not on file  . Number of children: Not on file  . Years of education: Not on file  . Highest education level: Not on file  Occupational History  . Not on file  Social Needs  . Financial resource strain: Not on file  . Food insecurity    Worry: Not on file    Inability: Not on file  . Transportation needs    Medical: Not on file    Non-medical: Not on file  Tobacco Use  . Smoking status: Never Smoker  . Smokeless tobacco: Never Used  Substance and Sexual Activity  . Alcohol use: No    Alcohol/week: 0.0 standard drinks  . Drug use: No  . Sexual activity: Yes    Birth control/protection: Condom  Lifestyle  . Physical activity    Days per week: Not on file    Minutes per session: Not on file  . Stress: Not on file  Relationships  . Social Herbalist on  phone: Not on file    Gets together: Not on file    Attends religious service: Not on file    Active member of club or organization: Not on file    Attends meetings of clubs or organizations: Not on file    Relationship status: Not on file  . Intimate partner violence    Fear of current or ex partner: Not on file    Emotionally abused: Not on file    Physically abused: Not on file    Forced sexual activity: Not on file  Other Topics Concern  . Not on file  Social History Narrative  . Not on file    Current Meds  Medication Sig  . atorvastatin (LIPITOR) 10 MG tablet TAKE 1 TABLET BY MOUTH EVERY DAY  . Calcium-Magnesium-Vitamin D (CALCIUM 1200+D3 PO)   . cetirizine (ZYRTEC) 10 MG tablet Take 10 mg by mouth daily.  . Magnesium 250 MG TABS   . omega-3 acid ethyl esters (LOVAZA) 1 G capsule Take by  mouth 2 (two) times daily.  . Vitamin D, Cholecalciferol, 1000 UNITS CAPS Take by mouth.     ROS:  Review of Systems  Constitutional: Negative for fatigue, fever and unexpected weight change.  Respiratory: Negative for cough, shortness of breath and wheezing.   Cardiovascular: Negative for chest pain, palpitations and leg swelling.  Gastrointestinal: Negative for blood in stool, constipation, diarrhea, nausea and vomiting.  Endocrine: Negative for cold intolerance, heat intolerance and polyuria.  Genitourinary: Negative for dyspareunia, dysuria, flank pain, frequency, genital sores, hematuria, menstrual problem, pelvic pain, urgency, vaginal bleeding, vaginal discharge and vaginal pain.  Musculoskeletal: Positive for arthralgias. Negative for back pain, joint swelling and myalgias.  Skin: Negative for rash.  Neurological: Negative for dizziness, syncope, light-headedness, numbness and headaches.  Hematological: Negative for adenopathy.  Psychiatric/Behavioral: Negative for agitation, confusion, sleep disturbance and suicidal ideas. The patient is not nervous/anxious.      Objective: BP 120/72   Ht 5\' 3"  (1.6 m)   Wt 113 lb (51.3 kg)   LMP 09/21/2018 (Exact Date)   BMI 20.02 kg/m    Physical Exam Constitutional:      Appearance: She is well-developed.  Genitourinary:     Vulva, vagina, uterus, right adnexa and left adnexa normal.     No vulval lesion or tenderness noted.     No vaginal discharge, erythema or tenderness.     No cervical motion tenderness or polyp.     Uterus is not enlarged or tender.     No right or left adnexal mass present.     Right adnexa not tender.     Left adnexa not tender.  Neck:     Musculoskeletal: Normal range of motion.     Thyroid: No thyromegaly.  Cardiovascular:     Rate and Rhythm: Normal rate and regular rhythm.     Heart sounds: Normal heart sounds. No murmur.  Pulmonary:     Effort: Pulmonary effort is normal.     Breath sounds:  Normal breath sounds.  Chest:     Breasts:        Right: No mass, nipple discharge, skin change or tenderness.        Left: No mass, nipple discharge, skin change or tenderness.  Abdominal:     Palpations: Abdomen is soft.     Tenderness: There is no abdominal tenderness. There is no guarding.  Musculoskeletal: Normal range of motion.  Neurological:     General: No focal deficit  present.     Mental Status: She is alert and oriented to person, place, and time.     Cranial Nerves: No cranial nerve deficit.  Skin:    General: Skin is warm and dry.  Psychiatric:        Mood and Affect: Mood normal.        Behavior: Behavior normal.        Thought Content: Thought content normal.        Judgment: Judgment normal.  Vitals signs reviewed.     Assessment/Plan: Encounter for annual routine gynecological examination  Screening for breast cancer--pt current on mammo  Blood tests for routine general physical examination - Plan: Comprehensive metabolic panel, Lipid panel  Mixed hyperlipidemia - Plan: Lipid panel; check labs. Will f/u with results and RF lipitor based on labs.  Needs flu shot - Plan: Flu Vaccine QUAD 36+ mos IM (Fluarix, Quad PF)   GYN counsel breast self exam, mammography screening, adequate intake of calcium and vitamin D, diet and exercise     F/U  Return in about 1 year (around 09/28/2019).  Alicia B. Copland, PA-C 09/28/2018 3:55 PM

## 2018-09-30 ENCOUNTER — Encounter: Payer: Self-pay | Admitting: Obstetrics and Gynecology

## 2018-10-04 ENCOUNTER — Other Ambulatory Visit: Payer: Self-pay

## 2018-10-04 ENCOUNTER — Other Ambulatory Visit: Payer: BC Managed Care – PPO

## 2018-10-04 DIAGNOSIS — Z0000 Encounter for general adult medical examination without abnormal findings: Secondary | ICD-10-CM

## 2018-10-04 DIAGNOSIS — E782 Mixed hyperlipidemia: Secondary | ICD-10-CM | POA: Diagnosis not present

## 2018-10-05 ENCOUNTER — Other Ambulatory Visit: Payer: Self-pay | Admitting: Obstetrics and Gynecology

## 2018-10-05 LAB — LIPID PANEL
Chol/HDL Ratio: 2.4 ratio (ref 0.0–4.4)
Cholesterol, Total: 161 mg/dL (ref 100–199)
HDL: 67 mg/dL (ref >39)
LDL Chol Calc (NIH): 80 mg/dL (ref 0–99)
Triglycerides: 72 mg/dL (ref 0–149)
VLDL Cholesterol Cal: 14 mg/dL (ref 5–40)

## 2018-10-05 LAB — COMPREHENSIVE METABOLIC PANEL
ALT: 14 IU/L (ref 0–32)
AST: 18 IU/L (ref 0–40)
Albumin/Globulin Ratio: 2.3 — ABNORMAL HIGH (ref 1.2–2.2)
Albumin: 4.5 g/dL (ref 3.8–4.8)
Alkaline Phosphatase: 52 IU/L (ref 39–117)
BUN/Creatinine Ratio: 24 — ABNORMAL HIGH (ref 9–23)
BUN: 17 mg/dL (ref 6–24)
Bilirubin Total: 0.4 mg/dL (ref 0.0–1.2)
CO2: 25 mmol/L (ref 20–29)
Calcium: 9.5 mg/dL (ref 8.7–10.2)
Chloride: 101 mmol/L (ref 96–106)
Creatinine, Ser: 0.71 mg/dL (ref 0.57–1.00)
GFR calc Af Amer: 116 mL/min/{1.73_m2} (ref >59)
GFR calc non Af Amer: 100 mL/min/{1.73_m2} (ref >59)
Globulin, Total: 2 g/dL (ref 1.5–4.5)
Glucose: 94 mg/dL (ref 65–99)
Potassium: 4.6 mmol/L (ref 3.5–5.2)
Sodium: 139 mmol/L (ref 134–144)
Total Protein: 6.5 g/dL (ref 6.0–8.5)

## 2018-10-05 MED ORDER — ATORVASTATIN CALCIUM 10 MG PO TABS: 10.0000 mg | ORAL_TABLET | Freq: Every day | ORAL | 3 refills | Status: DC

## 2018-10-05 NOTE — Progress Notes (Signed)
Pt aware.

## 2018-10-05 NOTE — Progress Notes (Signed)
Pls let pt know labs normal, cholesterol levels good. I refilled lipitor for the year. Labs released to MyChart. Thx.

## 2018-10-05 NOTE — Progress Notes (Signed)
Rx RF lipitor for 1 yr. Normal lipids 9/20.

## 2018-12-29 ENCOUNTER — Ambulatory Visit: Payer: BC Managed Care – PPO | Attending: Internal Medicine

## 2018-12-29 DIAGNOSIS — Z20822 Contact with and (suspected) exposure to covid-19: Secondary | ICD-10-CM

## 2018-12-29 DIAGNOSIS — Z20828 Contact with and (suspected) exposure to other viral communicable diseases: Secondary | ICD-10-CM | POA: Diagnosis not present

## 2018-12-30 LAB — NOVEL CORONAVIRUS, NAA: SARS-CoV-2, NAA: NOT DETECTED

## 2019-04-14 IMAGING — MG MM DIGITAL SCREENING BILAT W/ TOMO W/ CAD
8 series · 9 of 24 positions shown · non-contrast
Comparison: Previous exam(s).

CLINICAL DATA: Screening.

EXAM:
DIGITAL SCREENING BILATERAL MAMMOGRAM WITH TOMO AND CAD

[L CC synth-2D]
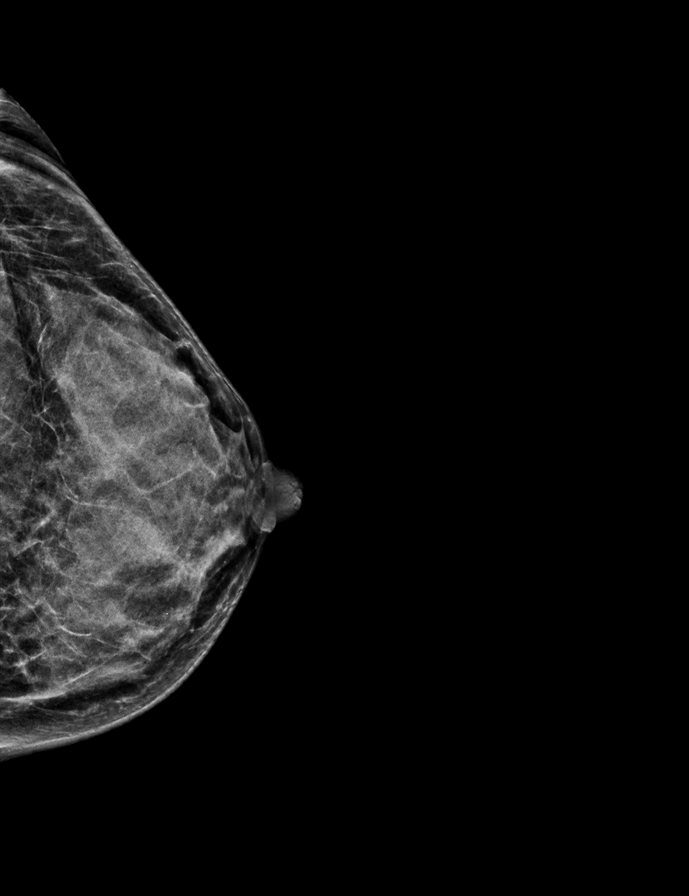

[R CC synth-2D]
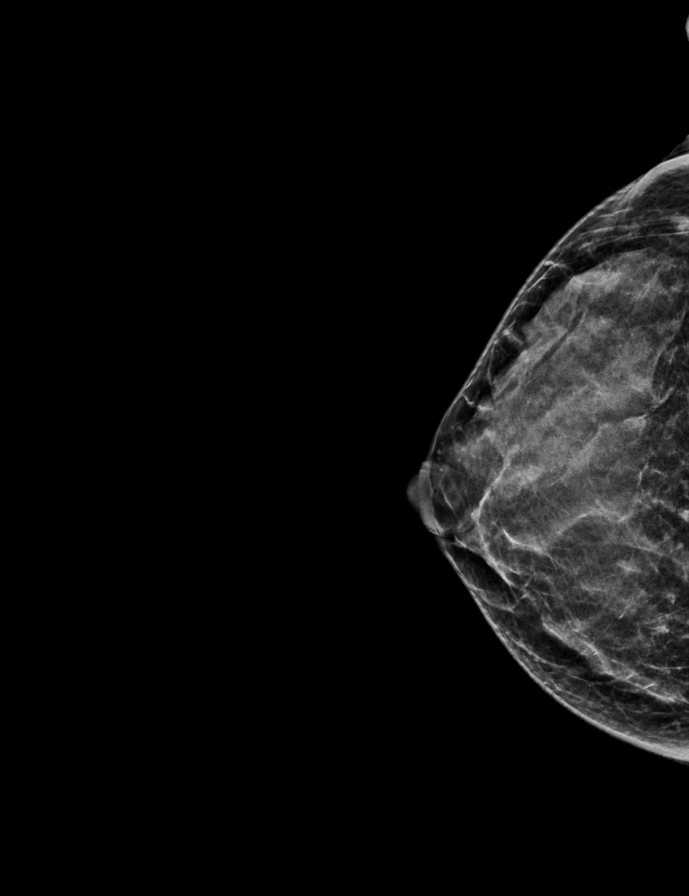

[R MLO synth-2D]
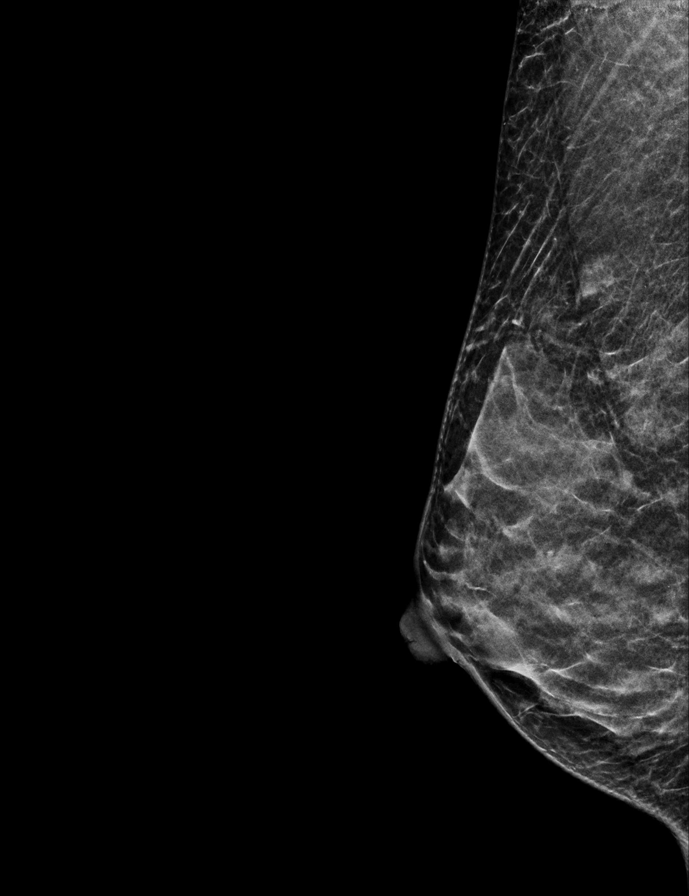

[L MLO synth-2D]
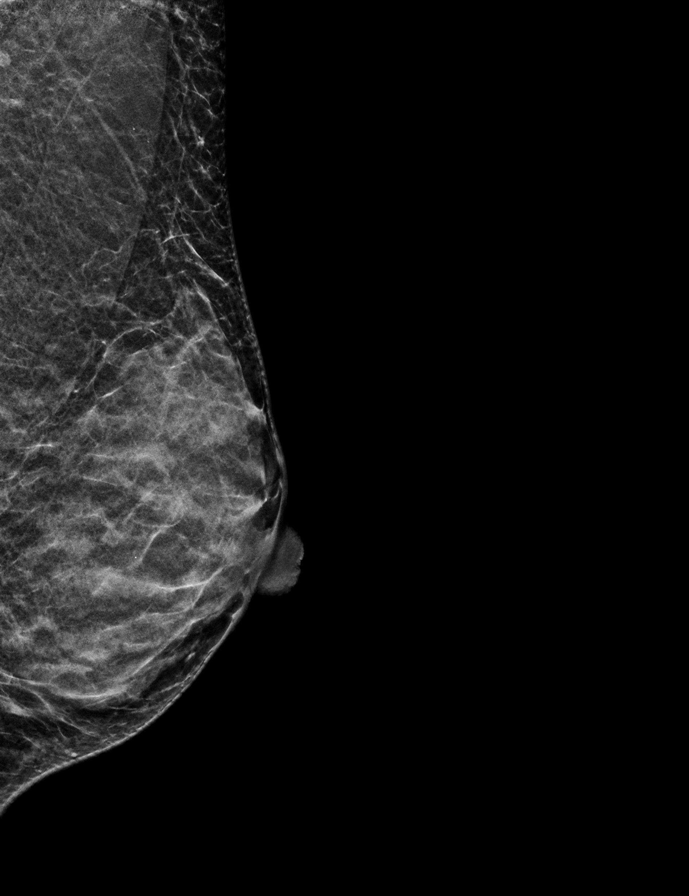

[R CC tomo · 2 of 37 frames shown]
[frame 13/37]
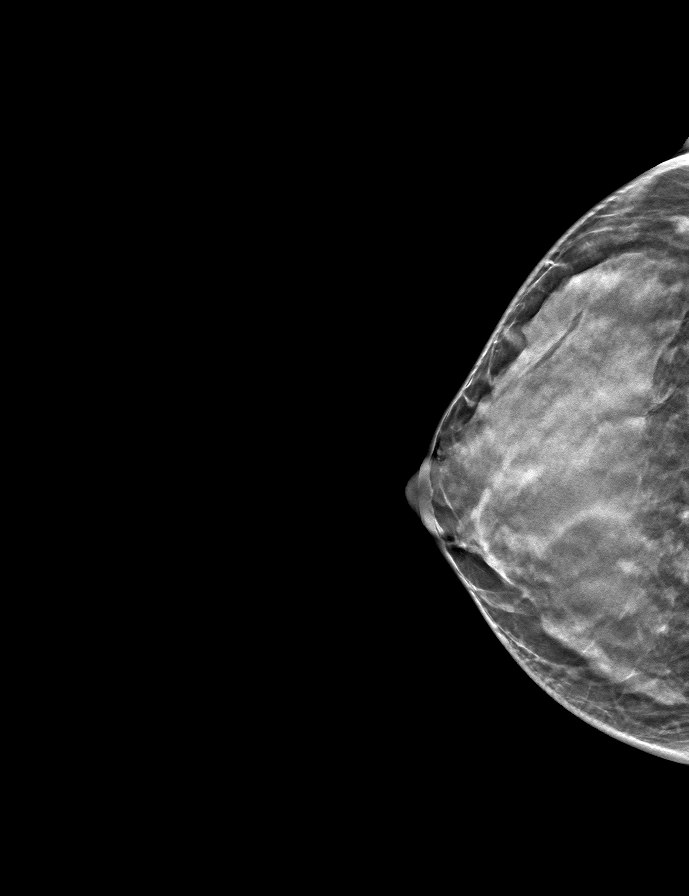
[frame 19/37]
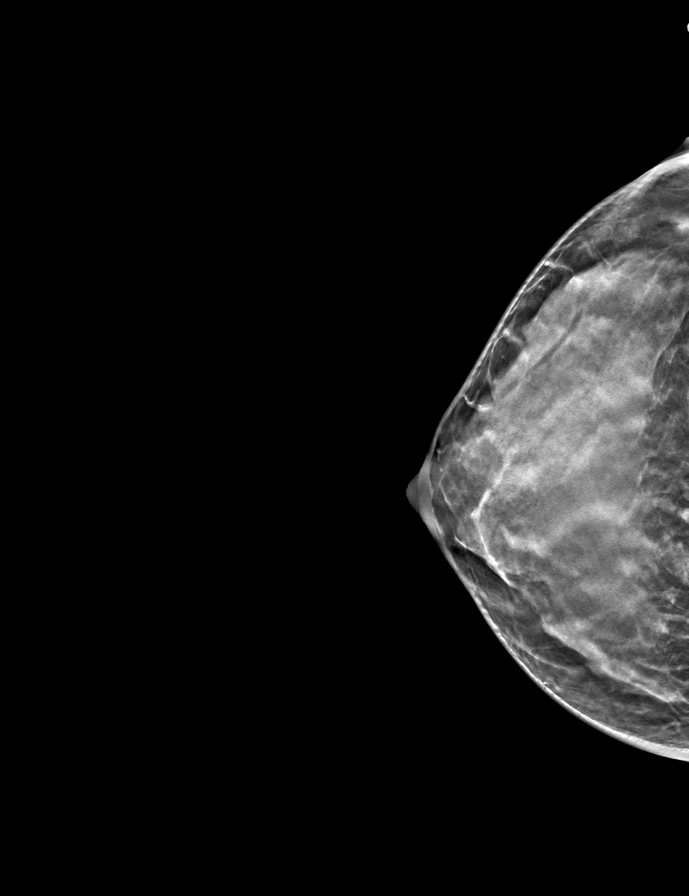

[L MLO tomo · tomo slice 17/33.0]
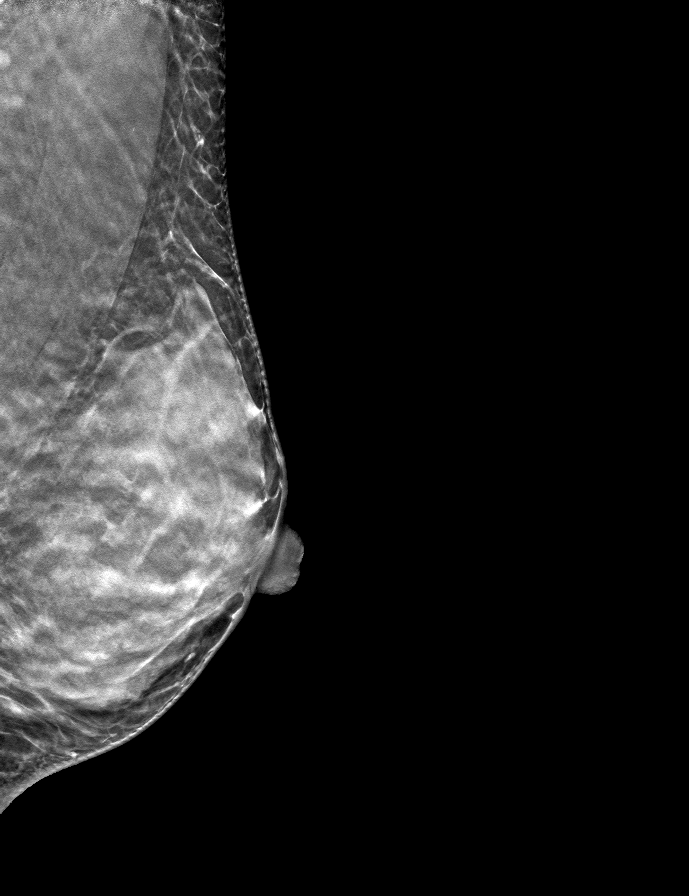

[L CC tomo · tomo slice 21/40.0]
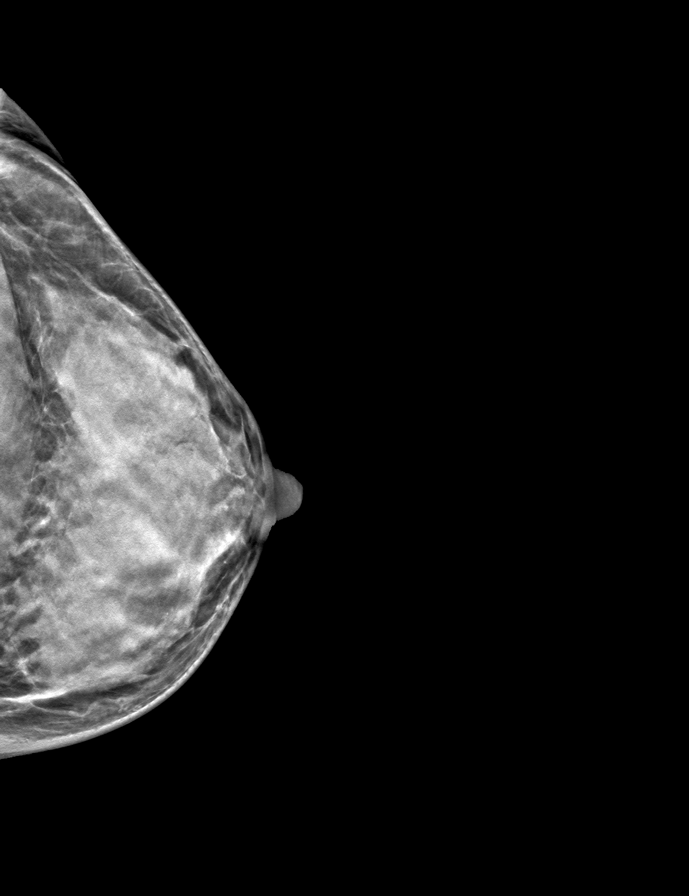

[R MLO tomo · tomo slice 17/34.0]
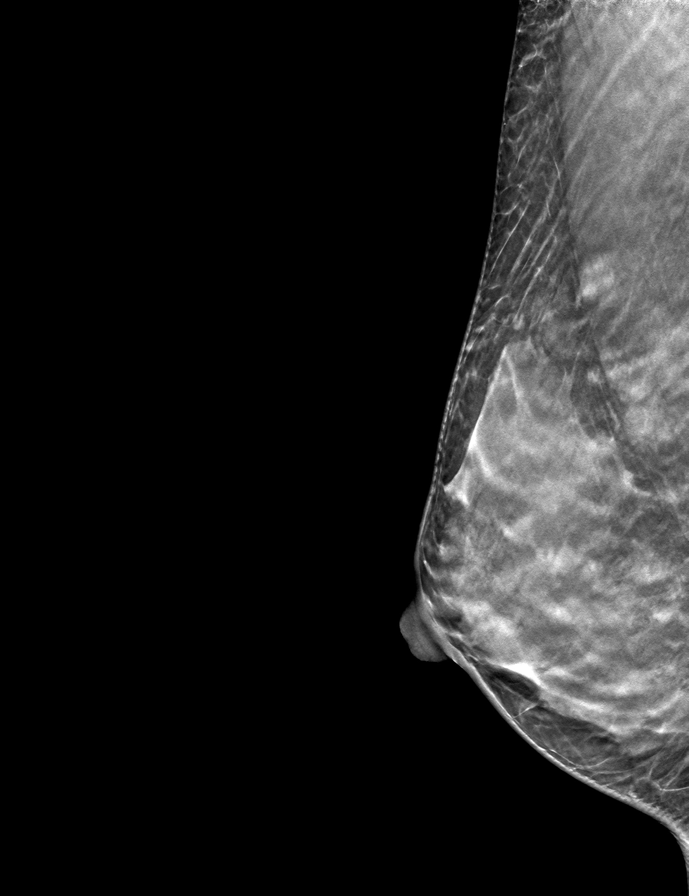

[9 of 24 positions shown; findings below may reference images not displayed]

ACR Breast Density Category d: The breast tissue is extremely dense,
which lowers the sensitivity of mammography. d: The breast tissue is
extremely dense, which lowers the sensitivity of mammography.
FINDINGS: There are no findings suspicious for malignancy. Images were
processed with CAD.
IMPRESSION: No mammographic evidence of malignancy. A result letter of this
screening mammogram will be mailed directly to the patient.

RECOMMENDATION:
Screening mammogram in one year. (Code:OU-U-23F)

BI-RADS CATEGORY  1: Negative.

## 2019-06-30 DIAGNOSIS — M25572 Pain in left ankle and joints of left foot: Secondary | ICD-10-CM | POA: Diagnosis not present

## 2019-08-01 ENCOUNTER — Other Ambulatory Visit: Payer: Self-pay | Admitting: Obstetrics and Gynecology

## 2019-08-01 DIAGNOSIS — Z1231 Encounter for screening mammogram for malignant neoplasm of breast: Secondary | ICD-10-CM

## 2019-09-06 ENCOUNTER — Ambulatory Visit
Admission: RE | Admit: 2019-09-06 | Discharge: 2019-09-06 | Disposition: A | Discharge status: Home / Self Care | Payer: BC Managed Care – PPO | Source: Ambulatory Visit | Attending: Obstetrics and Gynecology | Admitting: Obstetrics and Gynecology

## 2019-09-06 ENCOUNTER — Other Ambulatory Visit: Payer: Self-pay

## 2019-09-06 ENCOUNTER — Encounter: Payer: Self-pay | Admitting: Obstetrics and Gynecology

## 2019-09-06 DIAGNOSIS — Z1231 Encounter for screening mammogram for malignant neoplasm of breast: Secondary | ICD-10-CM | POA: Insufficient documentation

## 2019-09-28 DIAGNOSIS — Z23 Encounter for immunization: Secondary | ICD-10-CM | POA: Diagnosis not present

## 2019-10-03 NOTE — Progress Notes (Signed)
PCP:  Patient, No Pcp Per   Chief Complaint  Patient presents with  . Gynecologic Exam     HPI:      Ms. CATHE BILGER is a 49 y.o. [id number] who LMP was Patient's last menstrual period was 09/14/2019 (exact date)., presents today for her annual examination.  Her menses are regular every 28 days, lasting 4 days. Has n/v and hot flashes first day of menses, but had similar sx in adolescence. No vasomotor sx otherwise. Dysmenorrhea mild, improved with NSAIDs. She has occas intermenstrual bleeding. DUB sx from 6/19 improved (cycles were closer to Q2 wks). Had neg eval with labs/u/s.  Sex activity: single partner, contraception - condoms most of the time and rhythm method.  Last Pap: 08/23/15 Results were: no abnormalities /neg HPV DNA  Hx of STDs: none  Last mammogram: 09/06/19 Results were: normal--routine follow-up in 12 months There is no FH of breast cancer. There is no FH of ovarian cancer. The patient does do self-breast exams. There is a FH met prostate or bladder cancer in her dad, and non-aggressive prostate cancer in her PGF. Will follow FH for genetic testing.   Tobacco use: The patient denies current or previous tobacco use. Alcohol use: social drinker No drug use.  Exercise: very active  Colonoscopy: Never; pt interested in GI ref  She does get adequate calcium and Vitamin D in her diet.  She had elevated lipids 10/18 and was started on lipitor 10 mg daily.  No side effects. Due for labs. Doing well.   Past Medical History:  Diagnosis Date  . Arthritis   . Heart murmur   . Mitral valve prolapse   . OCD (obsessive compulsive disorder)   . Shingles     Past Surgical History:  Procedure Laterality Date  . DILATION AND CURETTAGE OF UTERUS  2004  . KNEE ARTHROSCOPY  1987    Family History  Problem Relation Age of Onset  . Prostate cancer Father 53       stage 4  . Bladder Cancer Father 32       Malignant  . Alzheimer's disease Maternal Grandmother   .  Diabetes Mellitus II Maternal Grandmother   . Heart disease Maternal Grandmother 74  . Other Maternal Grandmother        Myocardial infarction old/healed  . Diabetes Mellitus II Maternal Grandfather   . Prostate cancer Paternal Grandfather 45  . Kidney cancer Neg Hx   . Breast cancer Neg Hx     Social History   Socioeconomic History  . Marital status: Married    Spouse name: Not on file  . Number of children: Not on file  . Years of education: Not on file  . Highest education level: Not on file  Occupational History  . Not on file  Tobacco Use  . Smoking status: Never Smoker  . Smokeless tobacco: Never Used  Vaping Use  . Vaping Use: Never used  Substance and Sexual Activity  . Alcohol use: No    Alcohol/week: 0.0 standard drinks  . Drug use: No  . Sexual activity: Yes    Birth control/protection: Condom  Other Topics Concern  . Not on file  Social History Narrative  . Not on file   Social Determinants of Health   Financial Resource Strain:   . Difficulty of Paying Living Expenses: Not on file  Food Insecurity:   . Worried About Charity fundraiser in the Last Year: Not on file  .  Ran Out of Food in the Last Year: Not on file  Transportation Needs:   . Lack of Transportation (Medical): Not on file  . Lack of Transportation (Non-Medical): Not on file  Physical Activity:   . Days of Exercise per Week: Not on file  . Minutes of Exercise per Session: Not on file  Stress:   . Feeling of Stress : Not on file  Social Connections:   . Frequency of Communication with Friends and Family: Not on file  . Frequency of Social Gatherings with Friends and Family: Not on file  . Attends Religious Services: Not on file  . Active Member of Clubs or Organizations: Not on file  . Attends Archivist Meetings: Not on file  . Marital Status: Not on file  Intimate Partner Violence:   . Fear of Current or Ex-Partner: Not on file  . Emotionally Abused: Not on file  .  Physically Abused: Not on file  . Sexually Abused: Not on file    Current Meds  Medication Sig  . atorvastatin (LIPITOR) 10 MG tablet Take 1 tablet (10 mg total) by mouth daily.  . Calcium-Magnesium-Vitamin D (CALCIUM 1200+D3 PO)   . cetirizine (ZYRTEC) 10 MG tablet Take 10 mg by mouth daily.  . Magnesium 250 MG TABS   . omega-3 acid ethyl esters (LOVAZA) 1 G capsule Take by mouth 2 (two) times daily.  . Vitamin D, Cholecalciferol, 1000 UNITS CAPS Take by mouth.     ROS:  Review of Systems  Constitutional: Negative for fatigue, fever and unexpected weight change.  Respiratory: Negative for cough, shortness of breath and wheezing.   Cardiovascular: Negative for chest pain, palpitations and leg swelling.  Gastrointestinal: Negative for blood in stool, constipation, diarrhea, nausea and vomiting.  Endocrine: Negative for cold intolerance, heat intolerance and polyuria.  Genitourinary: Negative for dyspareunia, dysuria, flank pain, frequency, genital sores, hematuria, menstrual problem, pelvic pain, urgency, vaginal bleeding, vaginal discharge and vaginal pain.  Musculoskeletal: Negative for arthralgias, back pain, joint swelling and myalgias.  Skin: Negative for rash.  Neurological: Negative for dizziness, syncope, light-headedness, numbness and headaches.  Hematological: Negative for adenopathy.  Psychiatric/Behavioral: Negative for agitation, confusion, sleep disturbance and suicidal ideas. The patient is not nervous/anxious.      Objective: BP 100/70   Ht 5' 3"  (1.6 m)   Wt 108 lb (49 kg)   LMP 09/14/2019 (Exact Date)   BMI 19.13 kg/m    Physical Exam Constitutional:      Appearance: She is well-developed.  Genitourinary:     Vulva, vagina, uterus, right adnexa and left adnexa normal.     No vulval lesion or tenderness noted.     No vaginal discharge, erythema or tenderness.     No cervical motion tenderness or polyp.     Uterus is not enlarged or tender.     No  right or left adnexal mass present.     Right adnexa not tender.     Left adnexa not tender.  Neck:     Thyroid: No thyromegaly.  Cardiovascular:     Rate and Rhythm: Normal rate and regular rhythm.     Heart sounds: Normal heart sounds. No murmur heard.   Pulmonary:     Effort: Pulmonary effort is normal.     Breath sounds: Normal breath sounds.  Chest:     Breasts:        Right: No mass, nipple discharge, skin change or tenderness.  Left: No mass, nipple discharge, skin change or tenderness.  Abdominal:     Palpations: Abdomen is soft.     Tenderness: There is no abdominal tenderness. There is no guarding.  Musculoskeletal:        General: Normal range of motion.     Cervical back: Normal range of motion.  Neurological:     General: No focal deficit present.     Mental Status: She is alert and oriented to person, place, and time.     Cranial Nerves: No cranial nerve deficit.  Skin:    General: Skin is warm and dry.  Psychiatric:        Mood and Affect: Mood normal.        Behavior: Behavior normal.        Thought Content: Thought content normal.        Judgment: Judgment normal.  Vitals reviewed.     Assessment/Plan: Encounter for annual routine gynecological examination  Screening for breast cancer--pt current on mammo  Blood tests for routine general physical examination - Plan: Comprehensive metabolic panel, Lipid panel  Mixed hyperlipidemia - Plan: Lipid panel; check labs. Will f/u with results and RF lipitor based on labs.  Needs flu shot - Plan: Flu Vaccine QUAD 36+ mos IM (Fluarix, Quad PF)   GYN counsel breast self exam, mammography screening, adequate intake of calcium and vitamin D, diet and exercise     F/U  Return in about 1 year (around 10/03/2020).  Kelleen Stolze B. Jasiri Hanawalt, PA-C 10/04/2019 9:31 AM

## 2019-10-04 ENCOUNTER — Other Ambulatory Visit: Payer: Self-pay

## 2019-10-04 ENCOUNTER — Ambulatory Visit (INDEPENDENT_AMBULATORY_CARE_PROVIDER_SITE_OTHER): Payer: BC Managed Care – PPO | Admitting: Obstetrics and Gynecology

## 2019-10-04 ENCOUNTER — Encounter: Payer: Self-pay | Admitting: Obstetrics and Gynecology

## 2019-10-04 VITALS — BP 100/70 | Ht 63.0 in | Wt 108.0 lb

## 2019-10-04 DIAGNOSIS — Z01419 Encounter for gynecological examination (general) (routine) without abnormal findings: Secondary | ICD-10-CM | POA: Diagnosis not present

## 2019-10-04 DIAGNOSIS — E782 Mixed hyperlipidemia: Secondary | ICD-10-CM | POA: Diagnosis not present

## 2019-10-04 DIAGNOSIS — Z1231 Encounter for screening mammogram for malignant neoplasm of breast: Secondary | ICD-10-CM

## 2019-10-04 DIAGNOSIS — Z0000 Encounter for general adult medical examination without abnormal findings: Secondary | ICD-10-CM

## 2019-10-04 DIAGNOSIS — Z1211 Encounter for screening for malignant neoplasm of colon: Secondary | ICD-10-CM

## 2019-10-04 NOTE — Patient Instructions (Signed)
I value your feedback and entrusting us with your care. If you get a [facility] patient survey, I would appreciate you taking the time to let us know about your experience today. Thank you!  As of December 16, 2018, your lab results will be released to your MyChart immediately, before I even have a chance to see them. Please give me time to review them and contact you if there are any abnormalities. Thank you for your patience.  

## 2019-10-05 LAB — COMPREHENSIVE METABOLIC PANEL
ALT: 14 IU/L (ref 0–32)
AST: 18 IU/L (ref 0–40)
Albumin/Globulin Ratio: 2 (ref 1.2–2.2)
Albumin: 4.5 g/dL (ref 3.8–4.8)
Alkaline Phosphatase: 50 IU/L (ref 44–121)
BUN/Creatinine Ratio: 23 (ref 9–23)
BUN: 17 mg/dL (ref 6–24)
Bilirubin Total: 0.4 mg/dL (ref 0.0–1.2)
CO2: 21 mmol/L (ref 20–29)
Calcium: 9.5 mg/dL (ref 8.7–10.2)
Chloride: 103 mmol/L (ref 96–106)
Creatinine, Ser: 0.74 mg/dL (ref 0.57–1.00)
GFR calc Af Amer: 109 mL/min/{1.73_m2} (ref >59)
GFR calc non Af Amer: 95 mL/min/{1.73_m2} (ref >59)
Globulin, Total: 2.2 g/dL (ref 1.5–4.5)
Glucose: 93 mg/dL (ref 65–99)
Potassium: 4.3 mmol/L (ref 3.5–5.2)
Sodium: 139 mmol/L (ref 134–144)
Total Protein: 6.7 g/dL (ref 6.0–8.5)

## 2019-10-05 LAB — LIPID PANEL
Chol/HDL Ratio: 2.2 ratio (ref 0.0–4.4)
Cholesterol, Total: 148 mg/dL (ref 100–199)
HDL: 67 mg/dL (ref >39)
LDL Chol Calc (NIH): 69 mg/dL (ref 0–99)
Triglycerides: 57 mg/dL (ref 0–149)
VLDL Cholesterol Cal: 12 mg/dL (ref 5–40)

## 2019-10-05 MED ORDER — ATORVASTATIN CALCIUM 10 MG PO TABS: 10.0000 mg | ORAL_TABLET | Freq: Every day | ORAL | 3 refills | Status: DC

## 2019-10-05 NOTE — Addendum Note (Signed)
Addended by: Ardeth Perfect B on: 05/07/5866 09:19 AM   Modules accepted: Orders

## 2019-10-17 ENCOUNTER — Telehealth (INDEPENDENT_AMBULATORY_CARE_PROVIDER_SITE_OTHER): Payer: Self-pay | Admitting: Gastroenterology

## 2019-10-17 ENCOUNTER — Other Ambulatory Visit: Payer: Self-pay

## 2019-10-17 DIAGNOSIS — Z1211 Encounter for screening for malignant neoplasm of colon: Secondary | ICD-10-CM

## 2019-10-17 MED ORDER — PEG 3350-KCL-NA BICARB-NACL 420 G PO SOLR: 4000.0000 mL | Freq: Once | ORAL | 0 refills | Status: AC

## 2019-10-17 NOTE — Progress Notes (Signed)
Gastroenterology Pre-Procedure Review  Request Date: 11/10/19 Requesting Physician: Dr. Bonna Gains  PATIENT REVIEW QUESTIONS: The patient responded to the following health history questions as indicated:    1. Are you having any GI issues? no 2. Do you have a personal history of Polyps? no 3. Do you have a family history of Colon Cancer or Polyps? no 4. Diabetes Mellitus? no 5. Joint replacements in the past 12 months?no 6. Major health problems in the past 3 months?no 7. Any artificial heart valves, MVP, or defibrillator?no    MEDICATIONS & ALLERGIES:    Patient reports the following regarding taking any anticoagulation/antiplatelet therapy:   Plavix, Coumadin, Eliquis, Xarelto, Lovenox, Pradaxa, Brilinta, or Effient? no Aspirin? no  Patient confirms/reports the following medications:  Current Outpatient Medications  Medication Sig Dispense Refill  . atorvastatin (LIPITOR) 10 MG tablet Take 1 tablet (10 mg total) by mouth daily. 90 tablet 3  . Calcium-Magnesium-Vitamin D (CALCIUM 1200+D3 PO)     . cetirizine (ZYRTEC) 10 MG tablet Take 10 mg by mouth daily.    . Magnesium 250 MG TABS     . NON FORMULARY OXYBUTROL BLADDER PATCH PRN    . omega-3 acid ethyl esters (LOVAZA) 1 G capsule Take by mouth 2 (two) times daily.    . Vitamin D, Cholecalciferol, 1000 UNITS CAPS Take by mouth.    . polyethylene glycol-electrolytes (NULYTELY) 420 g solution Take 4,000 mLs by mouth once for 1 dose. 4000 mL 0   No current facility-administered medications for this visit.    Patient confirms/reports the following allergies:  No Known Allergies  Orders Placed This Encounter  Procedures  . Procedural/ Surgical Case Request: COLONOSCOPY WITH PROPOFOL    Standing Status:   Standing    Number of Occurrences:   1    Order Specific Question:   Pre-op diagnosis    Answer:   screening colonoscopy    Order Specific Question:   CPT Code    Answer:   17494    AUTHORIZATION INFORMATION Primary  Insurance: 1D#: Group #:  Secondary Insurance: 1D#: Group #:  SCHEDULE INFORMATION: Date: Thursday 11/10/19 Time: Location:[facility]

## 2019-10-31 ENCOUNTER — Telehealth: Payer: Self-pay

## 2019-10-31 NOTE — Telephone Encounter (Signed)
Returned patients call. Called patient to reschedule her procedure. LVM

## 2019-11-01 ENCOUNTER — Telehealth: Payer: Self-pay | Admitting: Gastroenterology

## 2019-11-01 ENCOUNTER — Telehealth: Payer: Self-pay

## 2019-11-01 NOTE — Telephone Encounter (Signed)
Patient left a voicemail stating she wants to rescheduled her colonoscopy on 11/10/2019. Called patient and left a message for call back to rescheduled

## 2019-11-01 NOTE — Telephone Encounter (Signed)
Patient has colon schedule with Dr. Darene Lamer for 11.4.21 that she needs to resch due to a conflict with her sons soccer sch. Pt had screening call on 10.11.21. Please call pt back to resch colon.

## 2019-11-01 NOTE — Telephone Encounter (Signed)
Called patient back and we moved patient colonoscopy to 12/14/2019. Informed patient she would go for covid test on 12/12/2019. She verbalized understanding. Updated the referral and called Trish. She moved patient procedure

## 2019-12-07 ENCOUNTER — Telehealth: Payer: Self-pay

## 2019-12-07 NOTE — Telephone Encounter (Signed)
Returned patients call. Pt had questions regarding medication and prep instructions. Questions answered. Pt verbalized understanding.

## 2019-12-12 ENCOUNTER — Other Ambulatory Visit
Admission: RE | Admit: 2019-12-12 | Discharge: 2019-12-12 | Disposition: A | Discharge status: Home / Self Care | Payer: BC Managed Care – PPO | Source: Ambulatory Visit | Attending: Gastroenterology | Admitting: Gastroenterology

## 2019-12-12 ENCOUNTER — Other Ambulatory Visit: Payer: Self-pay

## 2019-12-12 DIAGNOSIS — Z79899 Other long term (current) drug therapy: Secondary | ICD-10-CM | POA: Diagnosis not present

## 2019-12-12 DIAGNOSIS — Z1211 Encounter for screening for malignant neoplasm of colon: Secondary | ICD-10-CM | POA: Diagnosis not present

## 2019-12-12 DIAGNOSIS — Z01812 Encounter for preprocedural laboratory examination: Secondary | ICD-10-CM | POA: Insufficient documentation

## 2019-12-12 DIAGNOSIS — Z20822 Contact with and (suspected) exposure to covid-19: Secondary | ICD-10-CM | POA: Insufficient documentation

## 2019-12-13 LAB — SARS CORONAVIRUS 2 (TAT 6-24 HRS): SARS Coronavirus 2: NEGATIVE

## 2019-12-14 ENCOUNTER — Encounter
Admission: RE | Disposition: A | Discharge status: Home / Self Care | Payer: Self-pay | Source: Home / Self Care | Attending: Gastroenterology

## 2019-12-14 ENCOUNTER — Encounter: Payer: Self-pay | Admitting: Gastroenterology

## 2019-12-14 ENCOUNTER — Ambulatory Visit: Payer: BC Managed Care – PPO | Admitting: Anesthesiology

## 2019-12-14 ENCOUNTER — Ambulatory Visit
Admission: RE | Admit: 2019-12-14 | Discharge: 2019-12-14 | Disposition: A | Discharge status: Home / Self Care | Payer: BC Managed Care – PPO | Attending: Gastroenterology | Admitting: Gastroenterology

## 2019-12-14 DIAGNOSIS — Z1211 Encounter for screening for malignant neoplasm of colon: Secondary | ICD-10-CM | POA: Diagnosis not present

## 2019-12-14 DIAGNOSIS — Z20822 Contact with and (suspected) exposure to covid-19: Secondary | ICD-10-CM | POA: Insufficient documentation

## 2019-12-14 DIAGNOSIS — Z79899 Other long term (current) drug therapy: Secondary | ICD-10-CM | POA: Diagnosis not present

## 2019-12-14 HISTORY — PX: COLONOSCOPY WITH PROPOFOL: SHX5780

## 2019-12-14 LAB — POCT PREGNANCY, URINE: Preg Test, Ur: NEGATIVE

## 2019-12-14 SURGERY — COLONOSCOPY WITH PROPOFOL
Anesthesia: General

## 2019-12-14 MED ORDER — FENTANYL CITRATE (PF) 100 MCG/2ML IJ SOLN
INTRAMUSCULAR | Status: AC
  Filled 2019-12-14: qty 2

## 2019-12-14 MED ORDER — MIDAZOLAM HCL 2 MG/2ML IJ SOLN
INTRAMUSCULAR | Status: DC | PRN
  Administered 2019-12-14: 1 mg via INTRAVENOUS

## 2019-12-14 MED ORDER — MIDAZOLAM HCL 2 MG/2ML IJ SOLN
INTRAMUSCULAR | Status: AC
  Filled 2019-12-14: qty 2

## 2019-12-14 MED ORDER — PROPOFOL 500 MG/50ML IV EMUL
INTRAVENOUS | Status: AC
  Filled 2019-12-14: qty 50

## 2019-12-14 MED ORDER — GLYCOPYRROLATE PF 0.2 MG/ML IJ SOSY
PREFILLED_SYRINGE | INTRAMUSCULAR | Status: DC | PRN
  Administered 2019-12-14: .2 mg via INTRAVENOUS

## 2019-12-14 MED ORDER — SODIUM CHLORIDE 0.9 % IV SOLN: INTRAVENOUS | Status: DC

## 2019-12-14 MED ORDER — PROPOFOL 500 MG/50ML IV EMUL
INTRAVENOUS | Status: DC | PRN
  Administered 2019-12-14: 180 ug/kg/min via INTRAVENOUS

## 2019-12-14 MED ORDER — PROPOFOL 10 MG/ML IV BOLUS
INTRAVENOUS | Status: DC | PRN
  Administered 2019-12-14: 40 mg via INTRAVENOUS
  Administered 2019-12-14: 10 mg via INTRAVENOUS
  Administered 2019-12-14: 20 mg via INTRAVENOUS

## 2019-12-14 NOTE — Anesthesia Procedure Notes (Signed)
Date/Time: 12/14/2019 8:15 AM Performed by: Allean Found, CRNA Pre-anesthesia Checklist: Patient identified, Emergency Drugs available, Suction available, Patient being monitored and Timeout performed Patient Re-evaluated:Patient Re-evaluated prior to induction Oxygen Delivery Method: Nasal cannula Placement Confirmation: positive ETCO2

## 2019-12-14 NOTE — Op Note (Signed)
[facility] Patient Name: Allison Lambert Procedure Date: 12/14/2019 7:31 AM MRN: [id number] Account #: [id number] Date of Birth: Jul 14, 1970 Admit Type: Outpatient Age: 49 Room: [facility] ENDO ROOM 3 Gender: Female Note Status: Finalized Procedure:             Colonoscopy Indications:           Screening for colorectal malignant neoplasm Providers:             Abigaelle Verley B. Bonna Gains MD, MD Referring MD:          Gay Filler. Copland MD, MD (Referring MD) Medicines:             Monitored Anesthesia Care Complications:         No immediate complications. Procedure:             Pre-Anesthesia Assessment:                        - Prior to the procedure, a History and Physical was                         performed, and patient medications, allergies and                         sensitivities were reviewed. The patient's tolerance                         of previous anesthesia was reviewed.                        - The risks and benefits of the procedure and the                         sedation options and risks were discussed with the                         patient. All questions were answered and informed                         consent was obtained.                        - Patient identification and proposed procedure were                         verified prior to the procedure by the physician, the                         nurse, the anesthetist and the technician. The                         procedure was verified in the pre-procedure area in                         the procedure room in the endoscopy suite.                        - ASA Grade Assessment: II - A patient with mild  systemic disease.                        - After reviewing the risks and benefits, the patient                         was deemed in satisfactory condition to undergo the                         procedure.                        After obtaining informed  consent, the colonoscope was                         passed under direct vision. Throughout the procedure,                         the patient's blood pressure, pulse, and oxygen                         saturations were monitored continuously. The                         Colonoscope was introduced through the anus and                         advanced to the the cecum, identified by appendiceal                         orifice and ileocecal valve. The colonoscopy was                         performed with ease. The patient tolerated the                         procedure well. The quality of the bowel preparation                         was good. Findings:      The perianal and digital rectal examinations were normal.      The rectum, sigmoid colon, descending colon, transverse colon, ascending       colon and cecum appeared normal.      The retroflexed view of the distal rectum and anal verge was normal and       showed no anal or rectal abnormalities. Impression:            - The rectum, sigmoid colon, descending colon,                         transverse colon, ascending colon and cecum are normal.                        - The distal rectum and anal verge are normal on                         retroflexion view.                        - No  specimens collected. Recommendation:        - Discharge patient to home.                        - Resume previous diet.                        - Continue present medications.                        - Repeat colonoscopy in 10 years for screening                         purposes.                        - Return to primary care physician as previously                         scheduled.                        - The findings and recommendations were discussed with                         the patient.                        - The findings and recommendations were discussed with                         the patient's family.                        - In the  future, if patient develops new symptoms such                         as blood per rectum, abdominal pain, weight loss,                         altered bowel habits or any other reason for concern,                         patient should discuss this with thier PCP as they may                         need a GI referral at that time or evaluation for need                         for colonoscopy earlier than the recommended screening                         colonoscopy.                        In addition, if patient's family history of colon                         cancer changes (no family history at this time) in the  future, earlier screening may be indicated and patient                         should discuss this with PCP as well. Procedure Code(s):     --- Professional ---                        [phone], Colonoscopy, flexible; diagnostic, including                         collection of specimen(s) by brushing or washing, when                         performed (separate procedure) Diagnosis Code(s):     --- Professional ---                        Z12.11, Encounter for screening for malignant neoplasm                         of colon CPT copyright 2019 American Medical Association. All rights reserved. The codes documented in this report are preliminary and upon coder review may  be revised to meet current compliance requirements.  Vonda Antigua, MD Margretta Sidle B. Bonna Gains MD, MD 12/14/2019 8:49:46 AM This report has been signed electronically. Number of Addenda: 0 Note Initiated On: 12/14/2019 7:31 AM Scope Withdrawal Time: 0 hours 16 minutes 34 seconds  Total Procedure Duration: 0 hours 25 minutes 41 seconds       [facility]

## 2019-12-14 NOTE — H&P (Signed)
Vonda Antigua, MD [address], [place], [place], [place], [address] [address], [place], [place], [place], [address] Phone: [phone]  Fax: [phone]  Primary Care Physician:  Chad Cordial, PA-C   Pre-Procedure History & Physical: HPI:  Allison Lambert is a 49 y.o. female is here for a colonoscopy.   Past Medical History:  Diagnosis Date  . Arthritis   . Heart murmur   . Mitral valve prolapse   . OCD (obsessive compulsive disorder)   . Shingles     Past Surgical History:  Procedure Laterality Date  . DILATION AND CURETTAGE OF UTERUS  2004  . KNEE ARTHROSCOPY  1987    Prior to Admission medications   Medication Sig Start Date End Date Taking? Authorizing Provider  atorvastatin (LIPITOR) 10 MG tablet Take 1 tablet (10 mg total) by mouth daily. 4/38/88  Yes Copland, [name], PA-C  Calcium-Magnesium-Vitamin D (CALCIUM 1200+D3 PO)    Yes [provider]  cetirizine (ZYRTEC) 10 MG tablet Take 10 mg by mouth daily.   Yes [provider]  Magnesium 250 MG TABS    Yes [provider]  omega-3 acid ethyl esters (LOVAZA) 1 G capsule Take by mouth 2 (two) times daily.   Yes [provider]  Vitamin D, Cholecalciferol, 1000 UNITS CAPS Take by mouth.   Yes [provider]  NON FORMULARY OXYBUTROL BLADDER PATCH PRN    [provider]    Allergies as of 10/18/2019  . (No Known Allergies)    Family History  Problem Relation Age of Onset  . Prostate cancer Father 70       stage 4  . Bladder Cancer Father 17       Malignant  . Alzheimer's disease Maternal Grandmother   . Diabetes Mellitus II Maternal Grandmother   . Heart disease Maternal Grandmother 74  . Other Maternal Grandmother        Myocardial infarction old/healed  . Diabetes Mellitus II Maternal Grandfather   . Prostate cancer Paternal Grandfather 34  . Kidney cancer Neg Hx   . Breast cancer Neg Hx     Social History   Socioeconomic  History  . Marital status: Married    Spouse name: Not on file  . Number of children: Not on file  . Years of education: Not on file  . Highest education level: Not on file  Occupational History  . Not on file  Tobacco Use  . Smoking status: Never Smoker  . Smokeless tobacco: Never Used  Vaping Use  . Vaping Use: Never used  Substance and Sexual Activity  . Alcohol use: No    Alcohol/week: 0.0 standard drinks    Comment: a couple drinks a month   . Drug use: No  . Sexual activity: Yes    Birth control/protection: Condom  Other Topics Concern  . Not on file  Social History Narrative  . Not on file   Social Determinants of Health   Financial Resource Strain:   . Difficulty of Paying Living Expenses: Not on file  Food Insecurity:   . Worried About Charity fundraiser in the Last Year: Not on file  . Ran Out of Food in the Last Year: Not on file  Transportation Needs:   . Lack of Transportation (Medical): Not on file  . Lack of Transportation (Non-Medical): Not on file  Physical Activity:   . Days of Exercise per Week: Not on file  . Minutes of Exercise per  Session: Not on file  Stress:   . Feeling of Stress : Not on file  Social Connections:   . Frequency of Communication with Friends and Family: Not on file  . Frequency of Social Gatherings with Friends and Family: Not on file  . Attends Religious Services: Not on file  . Active Member of Clubs or Organizations: Not on file  . Attends Archivist Meetings: Not on file  . Marital Status: Not on file  Intimate Partner Violence:   . Fear of Current or Ex-Partner: Not on file  . Emotionally Abused: Not on file  . Physically Abused: Not on file  . Sexually Abused: Not on file    Review of Systems: See HPI, otherwise negative ROS  Physical Exam: BP 126/80   Pulse 100   Temp 97.8 F (36.6 C) (Temporal)   Resp 20   Ht 5\' 3"  (1.6 m)   Wt 49 kg   SpO2 100%   BMI 19.13 kg/m  General:   Alert,  pleasant  and cooperative in NAD Head:  Normocephalic and atraumatic. Neck:  Supple; no masses or thyromegaly. Lungs:  Clear throughout to auscultation, normal respiratory effort.    Heart:  +S1, +S2, Regular rate and rhythm, No edema. Abdomen:  Soft, nontender and nondistended. Normal bowel sounds, without guarding, and without rebound.   Neurologic:  Alert and  oriented x4;  grossly normal neurologically.  Impression/Plan: Allison Lambert is here for a colonoscopy to be performed for average risk screening.  Risks, benefits, limitations, and alternatives regarding  colonoscopy have been reviewed with the patient.  Questions have been answered.  All parties agreeable.   Virgel Manifold, MD  12/14/2019, 8:10 AM

## 2019-12-14 NOTE — Transfer of Care (Signed)
Immediate Anesthesia Transfer of Care Note  Patient: Allison Lambert  Procedure(s) Performed: COLONOSCOPY WITH PROPOFOL (N/A )  Patient Location: PACU  Anesthesia Type:General  Level of Consciousness: unresponsive  Airway & Oxygen Therapy: Patient Spontanous Breathing  Post-op Assessment: Report given to RN and Post -op Vital signs reviewed and stable  Post vital signs: Reviewed and stable  Last Vitals:  Vitals Value Taken Time  BP 92/59 12/14/19 0850  Temp    Pulse 95 12/14/19 0852  Resp 26 12/14/19 0852  SpO2 100 % 12/14/19 0852  Vitals shown include unvalidated device data.  Last Pain:  Vitals:   12/14/19 0722  TempSrc: Temporal  PainSc: 0-No pain         Complications: No complications documented.

## 2019-12-14 NOTE — Anesthesia Preprocedure Evaluation (Signed)
Anesthesia Evaluation  Patient identified by MRN, date of birth, ID band Patient awake    Reviewed: Allergy & Precautions, H&P , NPO status , Patient's Chart, lab work & pertinent test results, reviewed documented beta blocker date and time   Airway Mallampati: II   Neck ROM: full    Dental  (+) Poor Dentition   Pulmonary neg pulmonary ROS,    Pulmonary exam normal        Cardiovascular Exercise Tolerance: Good Normal cardiovascular exam+ Valvular Problems/Murmurs MVP  Rhythm:regular Rate:Normal     Neuro/Psych PSYCHIATRIC DISORDERS Anxiety negative neurological ROS     GI/Hepatic negative GI ROS, Neg liver ROS,   Endo/Other  negative endocrine ROS  Renal/GU negative Renal ROS  negative genitourinary   Musculoskeletal   Abdominal   Peds  Hematology negative hematology ROS (+)   Anesthesia Other Findings Past Medical History: No date: Arthritis No date: Heart murmur No date: Mitral valve prolapse No date: OCD (obsessive compulsive disorder) No date: Shingles Past Surgical History: 2004: Progress: KNEE ARTHROSCOPY BMI    Body Mass Index: 19.13 kg/m     Reproductive/Obstetrics negative OB ROS                             Anesthesia Physical Anesthesia Plan  ASA: II  Anesthesia Plan: General   Post-op Pain Management:    Induction:   PONV Risk Score and Plan:   Airway Management Planned:   Additional Equipment:   Intra-op Plan:   Post-operative Plan:   Informed Consent: I have reviewed the patients History and Physical, chart, labs and discussed the procedure including the risks, benefits and alternatives for the proposed anesthesia with the patient or authorized representative who has indicated his/her understanding and acceptance.     Dental Advisory Given  Plan Discussed with: CRNA  Anesthesia Plan Comments:          Anesthesia Quick Evaluation

## 2019-12-15 ENCOUNTER — Encounter: Payer: Self-pay | Admitting: Gastroenterology

## 2019-12-15 NOTE — Anesthesia Postprocedure Evaluation (Signed)
Anesthesia Post Note  Patient: Allison Lambert  Procedure(s) Performed: COLONOSCOPY WITH PROPOFOL (N/A )  Patient location during evaluation: PACU Anesthesia Type: General Level of consciousness: awake and alert Pain management: pain level controlled Vital Signs Assessment: post-procedure vital signs reviewed and stable Respiratory status: spontaneous breathing, nonlabored ventilation, respiratory function stable and patient connected to nasal cannula oxygen Cardiovascular status: blood pressure returned to baseline and stable Postop Assessment: no apparent nausea or vomiting Anesthetic complications: no   No complications documented.   Last Vitals:  Vitals:   12/14/19 0910 12/14/19 0913  BP: 98/84 98/84  Pulse: 82 86  Resp: 13 15  Temp:    SpO2: 100% 100%    Last Pain:  Vitals:   12/15/19 0723  TempSrc:   PainSc: 0-No pain                 Molli Barrows

## 2020-04-03 DIAGNOSIS — F633 Trichotillomania: Secondary | ICD-10-CM | POA: Diagnosis not present

## 2020-04-12 DIAGNOSIS — F633 Trichotillomania: Secondary | ICD-10-CM | POA: Diagnosis not present

## 2020-04-18 DIAGNOSIS — F633 Trichotillomania: Secondary | ICD-10-CM | POA: Diagnosis not present

## 2020-04-25 DIAGNOSIS — F633 Trichotillomania: Secondary | ICD-10-CM | POA: Diagnosis not present

## 2020-04-28 IMAGING — MG MM DIGITAL DIAGNOSTIC UNILAT*R* W/ TOMO W/ CAD
4 series · 4 of 12 positions shown · non-contrast
Comparison: Previous exam(s).

CLINICAL DATA: Patient presents as a recall from screening for a
right breast asymmetry.

EXAM:
DIGITAL DIAGNOSTIC UNILATERAL RIGHT MAMMOGRAM WITH CAD AND TOMO

[R ML synth-2D]
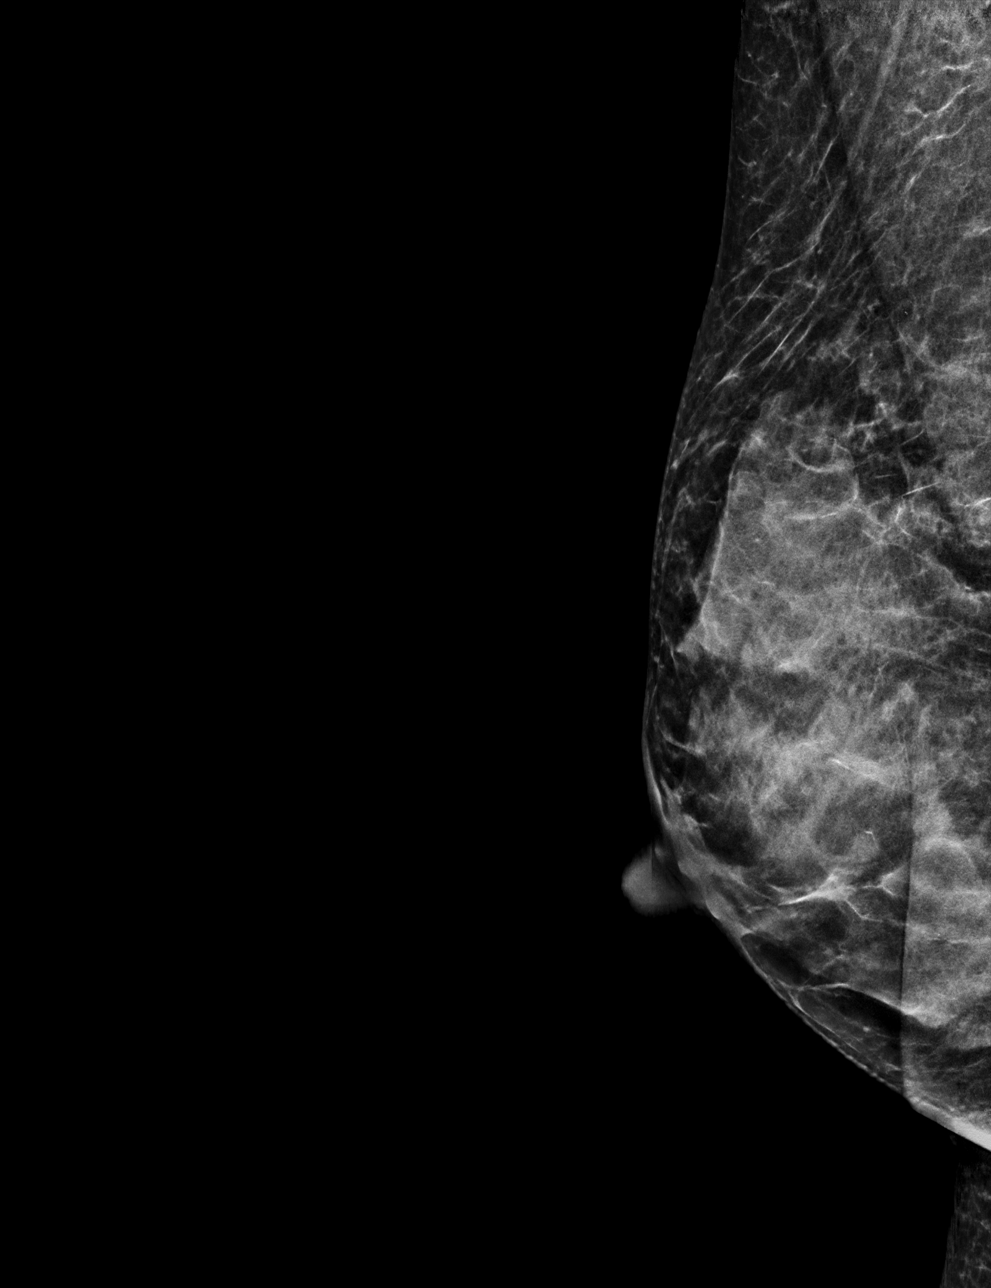

[R MLO synth-2D]
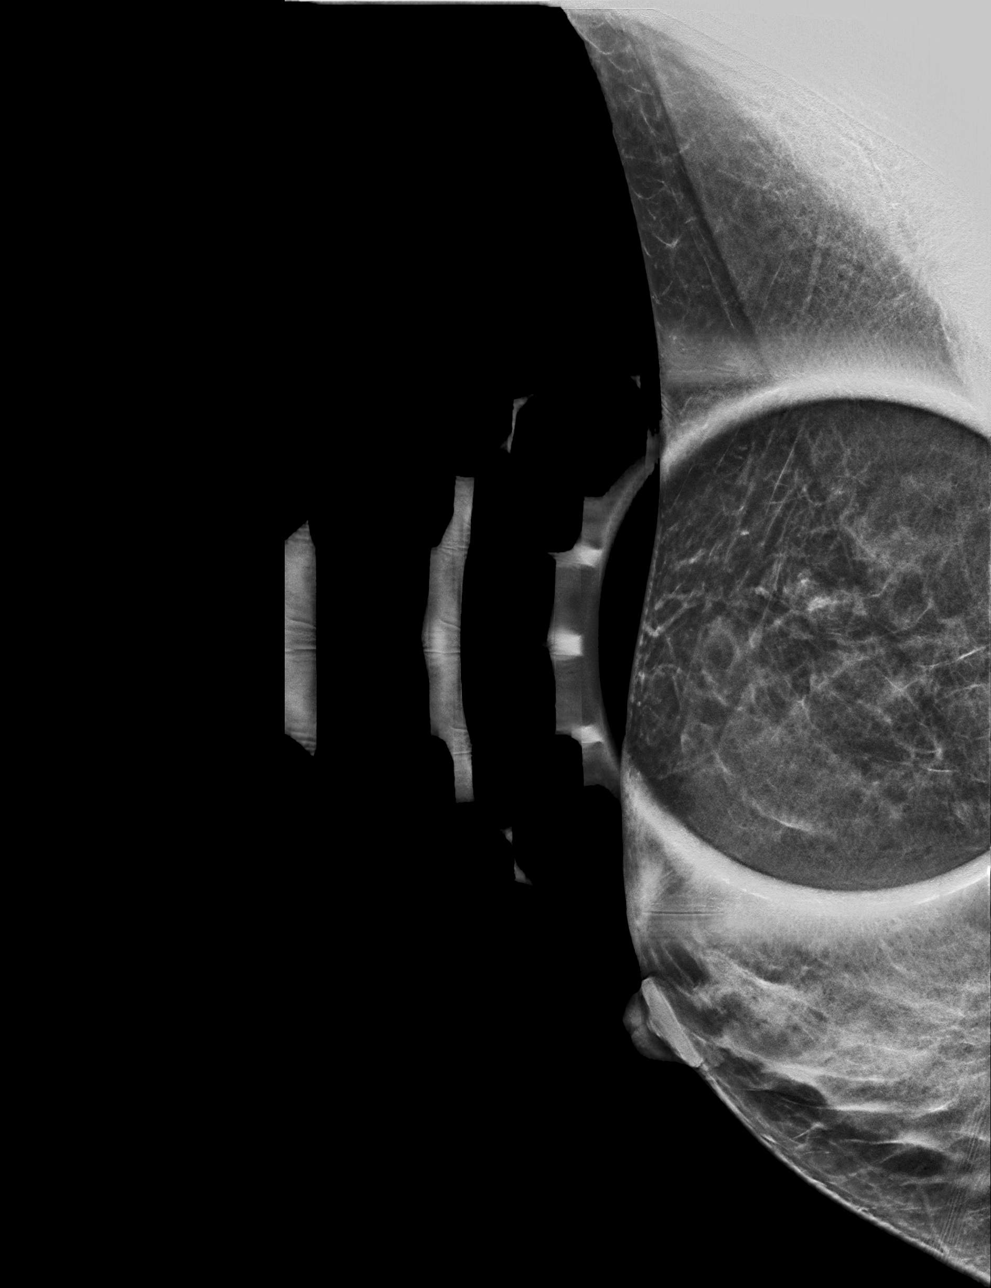

[R ML tomo · tomo slice 25/48.0]
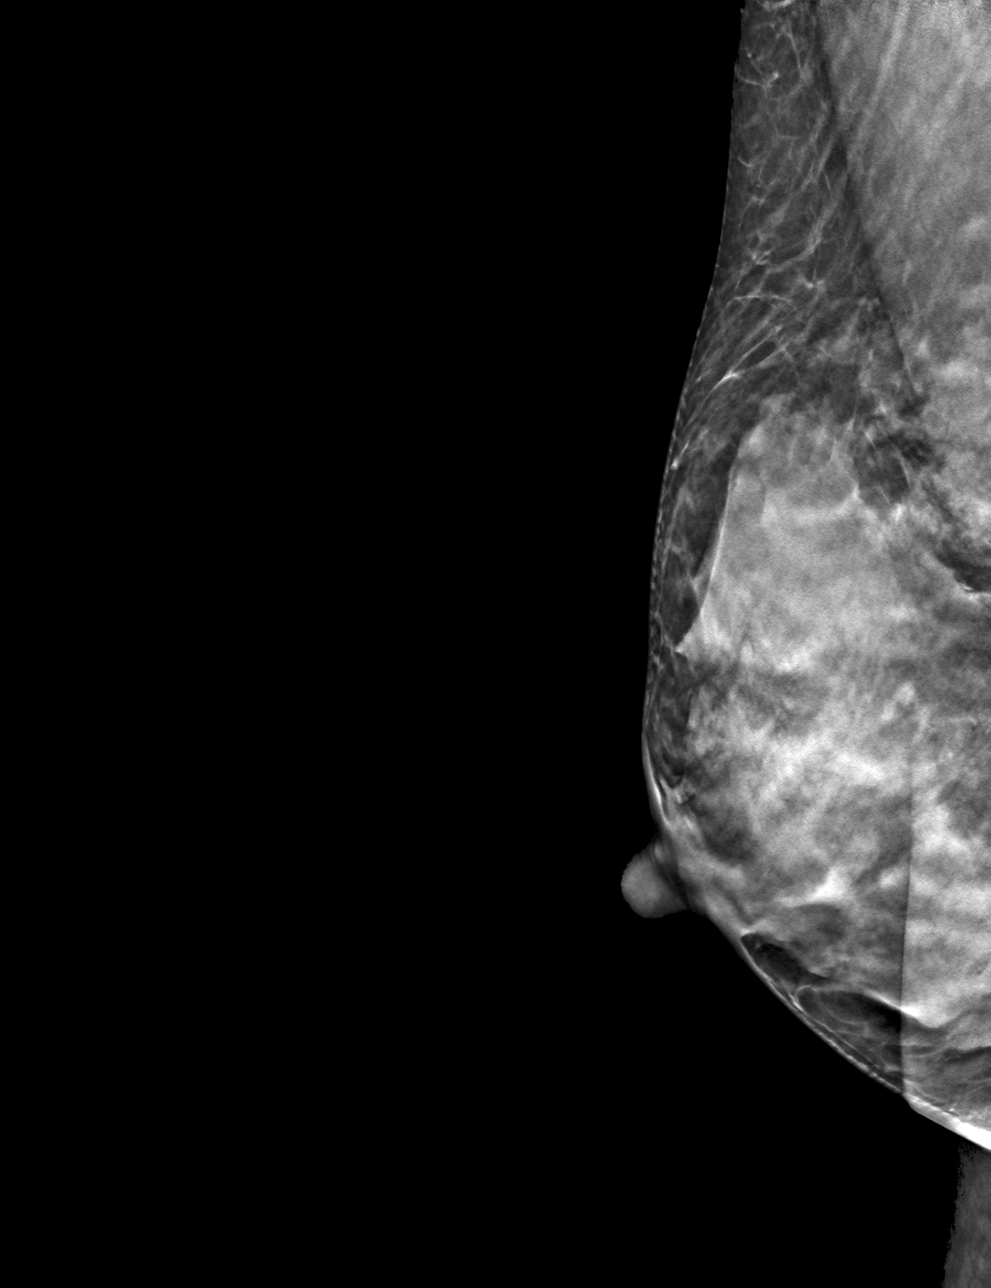

[R MLO tomo · tomo slice 22/43.0]
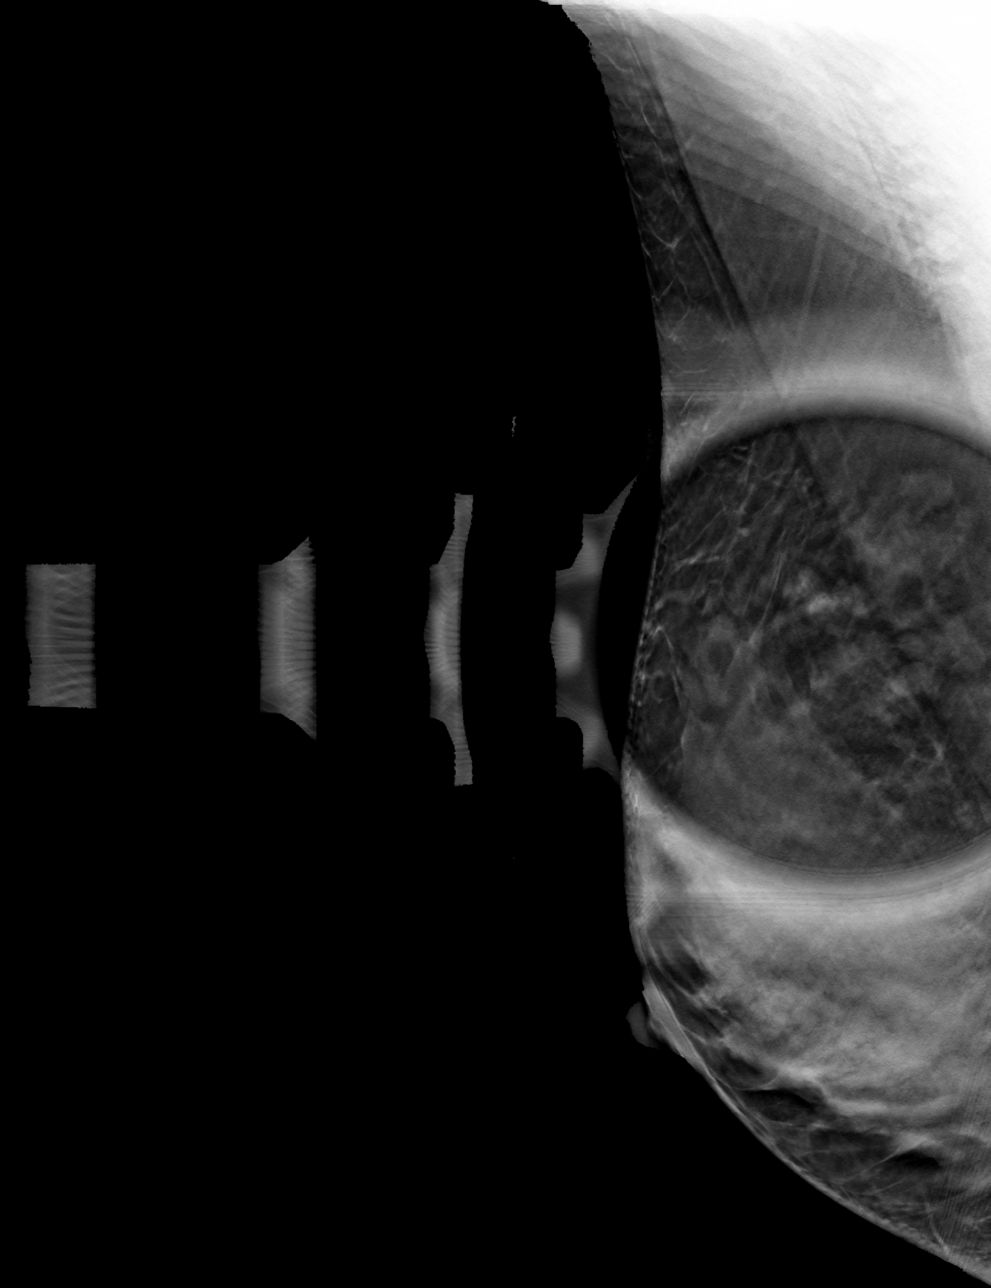

[4 of 12 positions shown; findings below may reference images not displayed]

ACR Breast Density Category d: The breast tissue is extremely dense,
which lowers the sensitivity of mammography.
FINDINGS: Spot compression tomosynthesis and full field mL views were
performed for the questioned area in the right breast. On the
additional imaging the tissue in this area disperses without
underlying mass or distortion. The questioned asymmetry most likely
represented normal overlapping fibroglandular tissue.

Mammographic images were processed with CAD.
IMPRESSION: No mammographic evidence of malignancy in the right breast.

RECOMMENDATION:
Screening mammogram in one year.(Code:P2-R-TO1)

I have discussed the findings and recommendations with the patient.
If applicable, a reminder letter will be sent to the patient
regarding the next appointment.

BI-RADS CATEGORY  1: Negative.

## 2020-05-02 DIAGNOSIS — F633 Trichotillomania: Secondary | ICD-10-CM | POA: Diagnosis not present

## 2020-05-17 DIAGNOSIS — F633 Trichotillomania: Secondary | ICD-10-CM | POA: Diagnosis not present

## 2020-06-06 DIAGNOSIS — F633 Trichotillomania: Secondary | ICD-10-CM | POA: Diagnosis not present

## 2020-06-19 DIAGNOSIS — F633 Trichotillomania: Secondary | ICD-10-CM | POA: Diagnosis not present

## 2020-07-02 DIAGNOSIS — F633 Trichotillomania: Secondary | ICD-10-CM | POA: Diagnosis not present

## 2020-08-01 ENCOUNTER — Other Ambulatory Visit: Payer: Self-pay | Admitting: Obstetrics and Gynecology

## 2020-08-01 DIAGNOSIS — Z1231 Encounter for screening mammogram for malignant neoplasm of breast: Secondary | ICD-10-CM

## 2020-09-06 ENCOUNTER — Ambulatory Visit
Admission: RE | Admit: 2020-09-06 | Discharge: 2020-09-06 | Disposition: A | Discharge status: Home / Self Care | Payer: BC Managed Care – PPO | Source: Ambulatory Visit | Attending: Obstetrics and Gynecology | Admitting: Obstetrics and Gynecology

## 2020-09-06 ENCOUNTER — Other Ambulatory Visit: Payer: Self-pay

## 2020-09-06 DIAGNOSIS — Z1231 Encounter for screening mammogram for malignant neoplasm of breast: Secondary | ICD-10-CM | POA: Diagnosis not present

## 2020-09-10 NOTE — Progress Notes (Signed)
[name], [name], PA-C   Chief Complaint  Patient presents with   Pelvic Pain    Right side ovary pain?, two cycles in one month    HPI:      [name] is a 50 y.o. [id number] whose LMP was Patient's last menstrual period was 09/03/2020 (exact date)., presents today for RLQ pain since before her 07/21/20 menses. Pain is a twinge (like ovulatory pain), intermittent, but hasn't improved. Menses are usually monthly, lasting 4 days, no BTB, mild dysmen. 7/22 menses was normal, but then had another full period on day 16. No menses again for 28 days and was normal but lasted an additional 3 days of light spotting with wiping only. RLQ discomfort has persisted. Hx of AUB 6/19 with neg labs and GYN u/s. No sx since.  Pt also with hx of IC and recent flare, so unsure if pain is from RT ovary vs IC. Sometimes discomfort feels more bladder related. Is doing her IC diet changes without sx change. Has urgency, no other UTI sx. No GI or vag sx.  She is sex active, no pain/bleeding.  Has annual 9/22.   Past Medical History:  Diagnosis Date   Arthritis    Heart murmur    Mitral valve prolapse    OCD (obsessive compulsive disorder)    Shingles     Past Surgical History:  Procedure Laterality Date   COLONOSCOPY WITH PROPOFOL N/A 12/14/2019   Procedure: COLONOSCOPY WITH PROPOFOL;  Surgeon: Virgel Manifold, MD;  Location: [facility] ENDOSCOPY;  Service: Endoscopy;  Laterality: N/A;   DILATION AND CURETTAGE OF UTERUS  2004   KNEE ARTHROSCOPY  1987    Family History  Problem Relation Age of Onset   Prostate cancer Father 22       stage 72   Bladder Cancer Father 71       Malignant   Alzheimer's disease Maternal Grandmother    Diabetes Mellitus II Maternal Grandmother    Heart disease Maternal Grandmother 74   Other Maternal Grandmother        Myocardial infarction old/healed   Diabetes Mellitus II Maternal Grandfather    Prostate cancer Paternal Grandfather 59   Kidney cancer Neg  Hx    Breast cancer Neg Hx     Social History   Socioeconomic History   Marital status: Married    Spouse name: Not on file   Number of children: Not on file   Years of education: Not on file   Highest education level: Not on file  Occupational History   Not on file  Tobacco Use   Smoking status: Never   Smokeless tobacco: Never  Vaping Use   Vaping Use: Never used  Substance and Sexual Activity   Alcohol use: No    Alcohol/week: 0.0 standard drinks    Comment: a couple drinks a month    Drug use: No   Sexual activity: Yes    Birth control/protection: Condom  Other Topics Concern   Not on file  Social History Narrative   Not on file   Social Determinants of Health   Financial Resource Strain: Not on file  Food Insecurity: Not on file  Transportation Needs: Not on file  Physical Activity: Not on file  Stress: Not on file  Social Connections: Not on file  Intimate Partner Violence: Not on file    Outpatient Medications Prior to Visit  Medication Sig Dispense Refill   atorvastatin (LIPITOR) 10 MG tablet  Take 1 tablet (10 mg total) by mouth daily. 90 tablet 3   Calcium-Magnesium-Vitamin D (CALCIUM 1200+D3 PO)      cetirizine (ZYRTEC) 10 MG tablet Take 10 mg by mouth daily.     NON FORMULARY OXYBUTROL BLADDER PATCH PRN     omega-3 acid ethyl esters (LOVAZA) 1 G capsule Take by mouth 2 (two) times daily.     Vitamin D, Cholecalciferol, 1000 UNITS CAPS Take by mouth.     Magnesium 250 MG TABS      No facility-administered medications prior to visit.      ROS:  Review of Systems  Constitutional:  Negative for fever.  Gastrointestinal:  Negative for blood in stool, constipation, diarrhea, nausea and vomiting.  Genitourinary:  Positive for pelvic pain, urgency and vaginal bleeding. Negative for dyspareunia, dysuria, flank pain, frequency, hematuria, vaginal discharge and vaginal pain.  Musculoskeletal:  Negative for back pain.  Skin:  Negative for rash.  BREAST:  No symptoms   OBJECTIVE:   Vitals:  BP 120/70   Ht '5\' 3"'$  (1.6 m)   Wt 117 lb (53.1 kg)   LMP 09/03/2020 (Exact Date)   BMI 20.73 kg/m   Physical Exam Vitals reviewed.  Constitutional:      Appearance: She is well-developed.  Pulmonary:     Effort: Pulmonary effort is normal.  Abdominal:     Palpations: Abdomen is soft.     Tenderness: There is no abdominal tenderness. There is no guarding or rebound.  Genitourinary:    General: Normal vulva.     Pubic Area: No rash.      Labia:        Right: No rash, tenderness or lesion.        Left: No rash, tenderness or lesion.      Vagina: Normal. No vaginal discharge, erythema or tenderness.     Cervix: Normal.     Uterus: Normal. Not enlarged and not tender.      Adnexa: Right adnexa normal and left adnexa normal.       Right: No mass or tenderness.         Left: No mass or tenderness.    Musculoskeletal:        General: Normal range of motion.     Cervical back: Normal range of motion.  Skin:    General: Skin is warm and dry.  Neurological:     General: No focal deficit present.     Mental Status: She is alert and oriented to person, place, and time.  Psychiatric:        Mood and Affect: Mood normal.        Behavior: Behavior normal.        Thought Content: Thought content normal.        Judgment: Judgment normal.    Results: Results for orders placed or performed in visit on 09/11/20 (from the past 24 hour(s))  POCT Urinalysis Dipstick     Status: Abnormal   Collection Time: 09/11/20 10:54 AM  Result Value Ref Range   Color, UA pale    Clarity, UA clear    Glucose, UA Negative Negative   Bilirubin, UA neg    Ketones, UA neg    Spec Grav, UA 1.010 1.010 - 1.025   Blood, UA mod    pH, UA 6.0 5.0 - 8.0   Protein, UA Negative Negative   Urobilinogen, UA     Nitrite, UA neg    Leukocytes, UA Negative Negative  Appearance     Odor     PT WITH [facility] D/C ON VAG EXAM; QUESTION BLOOD FROM BLADDER VS  VAGINALLY  Assessment/Plan: RLQ abdominal pain - Plan: US PELVIC COMPLETE WITH TRANSVAGINAL, Urine Culture, POCT Urinalysis Dipstick; neg exam, blood on UA. Check C&S to rule out UTI, check GYN u/s. If neg, sx most likely IC related. Will f/u with results.   Asymptomatic microscopic hematuria - Plan: Urine Culture; hx of IC with hematuria in past on UA  Abnormal uterine bleeding (AUB)--sx for 1 month; checking GYN u/s anyway. Otherwise f/u at annual in a couple wks.      Return if symptoms worsen or fail to improve.  Trenna Kiely B. Anela Bensman, PA-C 09/11/2020 10:58 AM

## 2020-09-11 ENCOUNTER — Other Ambulatory Visit: Payer: Self-pay | Admitting: Obstetrics and Gynecology

## 2020-09-11 ENCOUNTER — Encounter: Payer: Self-pay | Admitting: Obstetrics and Gynecology

## 2020-09-11 ENCOUNTER — Other Ambulatory Visit: Payer: Self-pay

## 2020-09-11 ENCOUNTER — Ambulatory Visit (INDEPENDENT_AMBULATORY_CARE_PROVIDER_SITE_OTHER): Payer: BC Managed Care – PPO | Admitting: Obstetrics and Gynecology

## 2020-09-11 VITALS — BP 120/70 | Ht 63.0 in | Wt 117.0 lb

## 2020-09-11 DIAGNOSIS — R1031 Right lower quadrant pain: Secondary | ICD-10-CM

## 2020-09-11 DIAGNOSIS — R3121 Asymptomatic microscopic hematuria: Secondary | ICD-10-CM | POA: Diagnosis not present

## 2020-09-11 DIAGNOSIS — N939 Abnormal uterine and vaginal bleeding, unspecified: Secondary | ICD-10-CM

## 2020-09-11 DIAGNOSIS — R928 Other abnormal and inconclusive findings on diagnostic imaging of breast: Secondary | ICD-10-CM

## 2020-09-11 DIAGNOSIS — N6489 Other specified disorders of breast: Secondary | ICD-10-CM

## 2020-09-11 LAB — POCT URINALYSIS DIPSTICK
Bilirubin, UA: NEGATIVE
Glucose, UA: NEGATIVE
Ketones, UA: NEGATIVE
Leukocytes, UA: NEGATIVE
Nitrite, UA: NEGATIVE
Protein, UA: NEGATIVE
Spec Grav, UA: 1.01 (ref 1.010–1.025)
pH, UA: 6 (ref 5.0–8.0)

## 2020-09-11 NOTE — Patient Instructions (Signed)
I value your feedback and you entrusting us with your care. If you get a [facility] patient survey, I would appreciate you taking the time to let us know about your experience today. Thank you! ? ? ?

## 2020-09-12 ENCOUNTER — Ambulatory Visit
Admission: RE | Admit: 2020-09-12 | Discharge: 2020-09-12 | Disposition: A | Discharge status: Home / Self Care | Payer: BC Managed Care – PPO | Source: Ambulatory Visit | Attending: Obstetrics and Gynecology | Admitting: Obstetrics and Gynecology

## 2020-09-12 DIAGNOSIS — N83291 Other ovarian cyst, right side: Secondary | ICD-10-CM | POA: Diagnosis not present

## 2020-09-12 DIAGNOSIS — N888 Other specified noninflammatory disorders of cervix uteri: Secondary | ICD-10-CM | POA: Diagnosis not present

## 2020-09-12 DIAGNOSIS — D259 Leiomyoma of uterus, unspecified: Secondary | ICD-10-CM | POA: Diagnosis not present

## 2020-09-12 DIAGNOSIS — N838 Other noninflammatory disorders of ovary, fallopian tube and broad ligament: Secondary | ICD-10-CM | POA: Diagnosis not present

## 2020-09-12 DIAGNOSIS — R1031 Right lower quadrant pain: Secondary | ICD-10-CM

## 2020-09-13 ENCOUNTER — Telehealth: Payer: Self-pay | Admitting: Obstetrics and Gynecology

## 2020-09-13 DIAGNOSIS — N83291 Other ovarian cyst, right side: Secondary | ICD-10-CM

## 2020-09-13 LAB — URINE CULTURE

## 2020-09-13 NOTE — Telephone Encounter (Signed)
Pt aware of neg C&S and bilat ovar cyst. RTO requires u/s f/u in 8 wks. Will sched in office with GYN u/s. Will f/u with results. Pt to f/u if sx worsen. Has annual in a few wks anyway.

## 2020-09-14 ENCOUNTER — Other Ambulatory Visit: Payer: Self-pay

## 2020-09-14 ENCOUNTER — Encounter: Payer: Self-pay | Admitting: Obstetrics and Gynecology

## 2020-09-14 ENCOUNTER — Ambulatory Visit
Admission: RE | Admit: 2020-09-14 | Discharge: 2020-09-14 | Disposition: A | Discharge status: Home / Self Care | Payer: BC Managed Care – PPO | Source: Ambulatory Visit | Attending: Obstetrics and Gynecology | Admitting: Obstetrics and Gynecology

## 2020-09-14 DIAGNOSIS — R928 Other abnormal and inconclusive findings on diagnostic imaging of breast: Secondary | ICD-10-CM | POA: Insufficient documentation

## 2020-09-14 DIAGNOSIS — R922 Inconclusive mammogram: Secondary | ICD-10-CM | POA: Diagnosis not present

## 2020-09-14 DIAGNOSIS — N6489 Other specified disorders of breast: Secondary | ICD-10-CM

## 2020-09-14 DIAGNOSIS — N3010 Interstitial cystitis (chronic) without hematuria: Secondary | ICD-10-CM

## 2020-09-14 DIAGNOSIS — I739 Peripheral vascular disease, unspecified: Secondary | ICD-10-CM

## 2020-09-14 MED ORDER — ELMIRON 100 MG PO CAPS: 100.0000 mg | ORAL_CAPSULE | Freq: Three times a day (TID) | ORAL | 3 refills | Status: DC

## 2020-09-17 ENCOUNTER — Other Ambulatory Visit: Payer: Self-pay | Admitting: Obstetrics and Gynecology

## 2020-09-17 DIAGNOSIS — R928 Other abnormal and inconclusive findings on diagnostic imaging of breast: Secondary | ICD-10-CM

## 2020-09-17 DIAGNOSIS — N6489 Other specified disorders of breast: Secondary | ICD-10-CM

## 2020-09-17 NOTE — Telephone Encounter (Signed)
PA fax from pharmacy came in this morning and PA has been submitted. Waiting on response.

## 2020-09-18 ENCOUNTER — Ambulatory Visit: Payer: BLUE CROSS/BLUE SHIELD | Admitting: Urology

## 2020-09-21 ENCOUNTER — Ambulatory Visit
Admission: RE | Admit: 2020-09-21 | Discharge: 2020-09-21 | Disposition: A | Discharge status: Home / Self Care | Payer: BC Managed Care – PPO | Source: Ambulatory Visit | Attending: Obstetrics and Gynecology | Admitting: Obstetrics and Gynecology

## 2020-09-21 ENCOUNTER — Other Ambulatory Visit: Payer: Self-pay

## 2020-09-21 DIAGNOSIS — R928 Other abnormal and inconclusive findings on diagnostic imaging of breast: Secondary | ICD-10-CM | POA: Insufficient documentation

## 2020-09-21 DIAGNOSIS — N6489 Other specified disorders of breast: Secondary | ICD-10-CM | POA: Insufficient documentation

## 2020-09-21 DIAGNOSIS — N6082 Other benign mammary dysplasias of left breast: Secondary | ICD-10-CM | POA: Diagnosis not present

## 2020-09-21 HISTORY — PX: BREAST BIOPSY: SHX20

## 2020-09-24 LAB — SURGICAL PATHOLOGY

## 2020-10-03 NOTE — Progress Notes (Signed)
PCP:  Chad Cordial, PA-C   Chief Complaint  Patient presents with   Gynecologic Exam    Annual -      HPI:      [name] is a 50 y.o. [id number] who LMP was Patient's last menstrual period was 09/29/2020., presents today for her annual examination.  Her menses are regular every 24-27 days, lasting 3-5 days. Has n/v and hot flashes first day of menses, but had similar sx in adolescence. Dysmenorrhea mild, improved with NSAIDs. Had intermenstrual bleeding 7/22 and GYN u/s showed bilat ovar cysts. Sx since resolved. Also had RLQ pain, question related to IC flare vs bilat ovar cysts.   Sex activity: single partner, contraception - condoms most of the time and rhythm method.  Last Pap: 08/23/15 Results were: no abnormalities /neg HPV DNA  Hx of STDs: none  Had RLQ pain since 7/22; GYN u/s showed bilat ovar cysts, but RTO has 2.4 mildly complex cyst requiring 8 wk u/s f/u. Has appt 11/07/20. RLQ pain improved.  Also had IC flare with pain, started on elmiron 9/22 since IC diet not helping. Sx getting a little better but still having urinary frequency/urgency. Has urology appt in a few wks.   Last mammogram: 09/06/20 Results were: cat 4 LT breast; bx showed PASH; f/u mammo and u/s LT breast due in 6 months; order placed There is no FH of breast cancer. There is no FH of ovarian cancer. The patient does do self-breast exams. There is a FH met bladder cancer in her dad, and non-aggressive prostate cancer in her PGF. Will follow FH for genetic testing.   Tobacco use: The patient denies current or previous tobacco use. Alcohol use: social drinker No drug use.  Exercise: very active  Colonoscopy: 12/21 at [facility],  repeat due after 10 yrs  She does get adequate calcium and Vitamin D in her diet.  She had elevated lipids 10/18 and was started on lipitor 10 mg daily.  No side effects. Due for labs. Doing well.   Past Medical History:  Diagnosis Date   Arthritis    Heart  murmur    Mitral valve prolapse    OCD (obsessive compulsive disorder)    Shingles     Past Surgical History:  Procedure Laterality Date   BREAST BIOPSY Left 09/21/2020   Sterei Bx, ribbon clip, path pending   COLONOSCOPY WITH PROPOFOL N/A 12/14/2019   Procedure: COLONOSCOPY WITH PROPOFOL;  Surgeon: Virgel Manifold, MD;  Location: [facility] ENDOSCOPY;  Service: Endoscopy;  Laterality: N/A;   DILATION AND CURETTAGE OF UTERUS  01/06/2002   KNEE ARTHROSCOPY  01/06/1985    Family History  Problem Relation Age of Onset   Prostate cancer Father 46       stage 3   Bladder Cancer Father 97       Malignant   Alzheimer's disease Maternal Grandmother    Diabetes Mellitus II Maternal Grandmother    Heart disease Maternal Grandmother 74   Other Maternal Grandmother        Myocardial infarction old/healed   Diabetes Mellitus II Maternal Grandfather    Prostate cancer Paternal Grandfather 25   Kidney cancer Neg Hx    Breast cancer Neg Hx     Social History   Socioeconomic History   Marital status: Married    Spouse name: Not on file   Number of children: Not on file   Years of education: Not on file   Highest education  level: Not on file  Occupational History   Not on file  Tobacco Use   Smoking status: Never   Smokeless tobacco: Never  Vaping Use   Vaping Use: Never used  Substance and Sexual Activity   Alcohol use: No    Alcohol/week: 0.0 standard drinks    Comment: a couple drinks a month    Drug use: No   Sexual activity: Yes    Birth control/protection: Condom  Other Topics Concern   Not on file  Social History Narrative   Not on file   Social Determinants of Health   Financial Resource Strain: Not on file  Food Insecurity: Not on file  Transportation Needs: Not on file  Physical Activity: Not on file  Stress: Not on file  Social Connections: Not on file  Intimate Partner Violence: Not on file    Current Meds  Medication Sig   atorvastatin (LIPITOR) 10  MG tablet Take 1 tablet (10 mg total) by mouth daily.   Calcium-Magnesium-Vitamin D (CALCIUM 1200+D3 PO)    Meth-Hyo-M Bl-Na Phos-Ph Sal (URIBEL) 118 MG CAPS Take 1 capsule (118 mg total) by mouth 4 (four) times daily as needed.   NON FORMULARY OXYBUTROL BLADDER PATCH PRN   omega-3 acid ethyl esters (LOVAZA) 1 G capsule Take by mouth 2 (two) times daily.   pentosan polysulfate (ELMIRON) 100 MG capsule Take 1 capsule (100 mg total) by mouth 3 (three) times daily.   Vitamin D, Cholecalciferol, 1000 UNITS CAPS Take by mouth.     ROS:  Review of Systems  Constitutional:  Negative for fatigue, fever and unexpected weight change.  Respiratory:  Negative for cough, shortness of breath and wheezing.   Cardiovascular:  Negative for chest pain, palpitations and leg swelling.  Gastrointestinal:  Negative for blood in stool, constipation, diarrhea, nausea and vomiting.  Endocrine: Negative for cold intolerance, heat intolerance and polyuria.  Genitourinary:  Positive for frequency. Negative for dyspareunia, dysuria, flank pain, genital sores, hematuria, menstrual problem, pelvic pain, urgency, vaginal bleeding, vaginal discharge and vaginal pain.  Musculoskeletal:  Negative for arthralgias, back pain, joint swelling and myalgias.  Skin:  Negative for rash.  Neurological:  Negative for dizziness, syncope, light-headedness, numbness and headaches.  Hematological:  Negative for adenopathy.  Psychiatric/Behavioral:  Negative for agitation, confusion, sleep disturbance and suicidal ideas. The patient is not nervous/anxious.     Objective: BP 100/62   Pulse 84   Ht 5' 3"  (1.6 m)   Wt 117 lb (53.1 kg)   LMP 09/29/2020   BMI 20.73 kg/m    Physical Exam Constitutional:      Appearance: She is well-developed.  Genitourinary:     Vulva normal.     Right Labia: No rash, tenderness or lesions.    Left Labia: No tenderness, lesions or rash.    No vaginal discharge, erythema or tenderness.       Right Adnexa: not tender and no mass present.    Left Adnexa: not tender and no mass present.    No cervical motion tenderness, friability or polyp.     Uterus is not enlarged or tender.  Breasts:    Right: No mass, nipple discharge, skin change or tenderness.     Left: No mass, nipple discharge, skin change or tenderness.  Neck:     Thyroid: No thyromegaly.  Cardiovascular:     Rate and Rhythm: Normal rate and regular rhythm.     Heart sounds: Normal heart sounds. No murmur heard. Pulmonary:  Effort: Pulmonary effort is normal.     Breath sounds: Normal breath sounds.  Abdominal:     Palpations: Abdomen is soft.     Tenderness: There is no abdominal tenderness. There is no guarding or rebound.  Musculoskeletal:        General: Normal range of motion.     Cervical back: Normal range of motion.  Lymphadenopathy:     Cervical: No cervical adenopathy.  Neurological:     General: No focal deficit present.     Mental Status: She is alert and oriented to person, place, and time.     Cranial Nerves: No cranial nerve deficit.  Skin:    General: Skin is warm and dry.  Psychiatric:        Mood and Affect: Mood normal.        Behavior: Behavior normal.        Thought Content: Thought content normal.        Judgment: Judgment normal.  Vitals reviewed.    Assessment/Plan: Encounter for annual routine gynecological examination  Cervical cancer screening - Plan: Cytology - PAP  Screening for HPV (human papillomavirus) - Plan: Cytology - PAP  IC (interstitial cystitis) - Plan: Meth-Hyo-M Bl-Na Phos-Ph Sal (URIBEL) 118 MG CAPS; sx improving with elmiron and IC diet, will add uribel prn frequency. Has urology appt 10/22.  Abnormal mammogram of left breast--pt to sched f/u imaging for 3/23  Blood tests for routine general physical examination - Plan: Comprehensive metabolic panel, Lipid panel  Mixed hyperlipidemia - Plan: Lipid panel; will RF meds based on results.   Right ovarian  cyst--has u/s f/u 11/22. Sx improved.   Meds ordered this encounter  Medications   Meth-Hyo-M Bl-Na Phos-Ph Sal (URIBEL) 118 MG CAPS    Sig: Take 1 capsule (118 mg total) by mouth 4 (four) times daily as needed.    Dispense:  30 capsule    Refill:  0    Order Specific Question:   Supervising Provider    Answer:   Gae Dry [[id number]]    GYN counsel breast self exam, mammography screening, adequate intake of calcium and vitamin D, diet and exercise     F/U  Return in about 1 year (around 10/04/2021).  Jaqueline Uber B. Chivonne Rascon, PA-C 10/04/2020 8:56 AM

## 2020-10-04 ENCOUNTER — Encounter: Payer: Self-pay | Admitting: Obstetrics and Gynecology

## 2020-10-04 ENCOUNTER — Other Ambulatory Visit (HOSPITAL_COMMUNITY)
Admission: RE | Admit: 2020-10-04 | Discharge: 2020-10-04 | Disposition: A | Discharge status: Home / Self Care | Payer: BC Managed Care – PPO | Source: Ambulatory Visit | Attending: Obstetrics and Gynecology | Admitting: Obstetrics and Gynecology

## 2020-10-04 ENCOUNTER — Other Ambulatory Visit: Payer: Self-pay

## 2020-10-04 ENCOUNTER — Ambulatory Visit (INDEPENDENT_AMBULATORY_CARE_PROVIDER_SITE_OTHER): Payer: BC Managed Care – PPO | Admitting: Obstetrics and Gynecology

## 2020-10-04 VITALS — BP 100/62 | HR 84 | Ht 63.0 in | Wt 117.0 lb

## 2020-10-04 DIAGNOSIS — N3010 Interstitial cystitis (chronic) without hematuria: Secondary | ICD-10-CM | POA: Diagnosis not present

## 2020-10-04 DIAGNOSIS — Z1151 Encounter for screening for human papillomavirus (HPV): Secondary | ICD-10-CM

## 2020-10-04 DIAGNOSIS — R928 Other abnormal and inconclusive findings on diagnostic imaging of breast: Secondary | ICD-10-CM

## 2020-10-04 DIAGNOSIS — Z01419 Encounter for gynecological examination (general) (routine) without abnormal findings: Secondary | ICD-10-CM | POA: Diagnosis not present

## 2020-10-04 DIAGNOSIS — N83201 Unspecified ovarian cyst, right side: Secondary | ICD-10-CM

## 2020-10-04 DIAGNOSIS — E782 Mixed hyperlipidemia: Secondary | ICD-10-CM

## 2020-10-04 DIAGNOSIS — Z124 Encounter for screening for malignant neoplasm of cervix: Secondary | ICD-10-CM | POA: Diagnosis not present

## 2020-10-04 DIAGNOSIS — Z0000 Encounter for general adult medical examination without abnormal findings: Secondary | ICD-10-CM

## 2020-10-04 MED ORDER — URIBEL 118 MG PO CAPS: 118.0000 mg | ORAL_CAPSULE | Freq: Four times a day (QID) | ORAL | 0 refills | Status: DC | PRN

## 2020-10-04 NOTE — Patient Instructions (Signed)
I value your feedback and you entrusting us with your care. If you get a [facility] patient survey, I would appreciate you taking the time to let us know about your experience today. Thank you!  [facility]: [phone]      

## 2020-10-05 LAB — LIPID PANEL
Chol/HDL Ratio: 2.7 ratio (ref 0.0–4.4)
Cholesterol, Total: 170 mg/dL (ref 100–199)
HDL: 64 mg/dL (ref >39)
LDL Chol Calc (NIH): 91 mg/dL (ref 0–99)
Triglycerides: 81 mg/dL (ref 0–149)
VLDL Cholesterol Cal: 15 mg/dL (ref 5–40)

## 2020-10-05 LAB — COMPREHENSIVE METABOLIC PANEL
ALT: 15 IU/L (ref 0–32)
AST: 20 IU/L (ref 0–40)
Albumin/Globulin Ratio: 1.8 (ref 1.2–2.2)
Albumin: 4.5 g/dL (ref 3.8–4.9)
Alkaline Phosphatase: 55 IU/L (ref 44–121)
BUN/Creatinine Ratio: 19 (ref 9–23)
BUN: 13 mg/dL (ref 6–24)
Bilirubin Total: 0.4 mg/dL (ref 0.0–1.2)
CO2: 23 mmol/L (ref 20–29)
Calcium: 9.8 mg/dL (ref 8.7–10.2)
Chloride: 102 mmol/L (ref 96–106)
Creatinine, Ser: 0.67 mg/dL (ref 0.57–1.00)
Globulin, Total: 2.5 g/dL (ref 1.5–4.5)
Glucose: 90 mg/dL (ref 70–99)
Potassium: 4.8 mmol/L (ref 3.5–5.2)
Sodium: 139 mmol/L (ref 134–144)
Total Protein: 7 g/dL (ref 6.0–8.5)
eGFR: 106 mL/min/{1.73_m2} (ref >59)

## 2020-10-05 LAB — CYTOLOGY - PAP
Comment: NEGATIVE
Diagnosis: NEGATIVE
High risk HPV: NEGATIVE

## 2020-10-05 MED ORDER — ATORVASTATIN CALCIUM 10 MG PO TABS: 10.0000 mg | ORAL_TABLET | Freq: Every day | ORAL | 3 refills | Status: DC

## 2020-10-05 NOTE — Addendum Note (Signed)
Addended by: Ardeth Perfect B on: 2/55/0016 01:01 PM   Modules accepted: Orders

## 2020-10-22 ENCOUNTER — Encounter: Payer: Self-pay | Admitting: Urology

## 2020-10-22 ENCOUNTER — Other Ambulatory Visit: Payer: Self-pay

## 2020-10-22 ENCOUNTER — Ambulatory Visit (INDEPENDENT_AMBULATORY_CARE_PROVIDER_SITE_OTHER): Payer: BC Managed Care – PPO | Admitting: Urology

## 2020-10-22 VITALS — BP 122/73 | HR 81 | Ht 63.0 in | Wt 117.0 lb

## 2020-10-22 DIAGNOSIS — N3020 Other chronic cystitis without hematuria: Secondary | ICD-10-CM

## 2020-10-22 DIAGNOSIS — R300 Dysuria: Secondary | ICD-10-CM

## 2020-10-22 LAB — URINALYSIS, COMPLETE
Bilirubin, UA: NEGATIVE
Glucose, UA: NEGATIVE
Ketones, UA: NEGATIVE
Nitrite, UA: NEGATIVE
Protein,UA: NEGATIVE
Specific Gravity, UA: <1.005 — ABNORMAL LOW (ref 1.005–1.030)
Urobilinogen, Ur: 0.2 mg/dL (ref 0.2–1.0)
pH, UA: 6 (ref 5.0–7.5)

## 2020-10-22 LAB — MICROSCOPIC EXAMINATION: Bacteria, UA: NONE SEEN

## 2020-10-22 MED ORDER — MIRABEGRON ER 50 MG PO TB24: 50.0000 mg | ORAL_TABLET | Freq: Every day | ORAL | 11 refills | Status: DC

## 2020-10-22 NOTE — Progress Notes (Signed)
10/22/2020 8:28 AM   Allison Lambert December 10, 1970 [id number]  Referring provider: Chad Cordial, PA-C [address] [place],  [place] [address]  Chief Complaint  Patient presents with   Cystitis    HPI: I was consulted to assess the patient for interstitial cystitis diagnosed many years ago.  She primarily manages that with the Oxytrol patch and also has overactive bladder.  Myrbetriq in the past has helped but was expensive.  She utilizes a diet  She was doing very well until August when on a bad day she would void 40 times a day with small-volume frequency voiding only very small amounts and would not feel empty.  Now she can void every 20 minutes or other times every 2 or 3 hours.  She gets up once or twice at night.  Flow varies and is volume dependent.  She might have a little bit of discomfort after she urinates but otherwise not a lot of pain.  She feels some fullness.  Primary care physician started her on Elmiron 1 month ago.  She takes Uribel.  No history of kidney stones bladder surgery or frequent bladder infections.  No neurologic issue.  No hysterectomy and bowel movements normal   PMH: Past Medical History:  Diagnosis Date   Arthritis    Heart murmur    Mitral valve prolapse    OCD (obsessive compulsive disorder)    Shingles     Surgical History: Past Surgical History:  Procedure Laterality Date   BREAST BIOPSY Left 09/21/2020   Sterei Bx, ribbon clip, path pending   COLONOSCOPY WITH PROPOFOL N/A 12/14/2019   Procedure: COLONOSCOPY WITH PROPOFOL;  Surgeon: Virgel Manifold, MD;  Location: [facility] ENDOSCOPY;  Service: Endoscopy;  Laterality: N/A;   DILATION AND CURETTAGE OF UTERUS  01/06/2002   KNEE ARTHROSCOPY  01/06/1985    Home Medications:  Allergies as of 10/22/2020   No Known Allergies      Medication List        Accurate as of October 22, 2020  8:28 AM. If you have any questions, ask your nurse or doctor.          STOP taking  these medications    cetirizine 10 MG tablet Commonly known as: ZYRTEC Stopped by: Reece Packer, MD       TAKE these medications    atorvastatin 10 MG tablet Commonly known as: LIPITOR Take 1 tablet (10 mg total) by mouth daily.   CALCIUM 1200+D3 PO   Elmiron 100 MG capsule Generic drug: pentosan polysulfate Take 1 capsule (100 mg total) by mouth 3 (three) times daily.   NON FORMULARY OXYBUTROL BLADDER PATCH PRN   omega-3 acid ethyl esters 1 g capsule Commonly known as: LOVAZA Take by mouth 2 (two) times daily.   Uribel 118 MG Caps Take 1 capsule (118 mg total) by mouth 4 (four) times daily as needed.   Vitamin D (Cholecalciferol) 25 MCG (1000 UT) Caps Take by mouth.        Allergies: No Known Allergies  Family History: Family History  Problem Relation Age of Onset   Prostate cancer Father 27       stage 55   Bladder Cancer Father 85       Malignant   Alzheimer's disease Maternal Grandmother    Diabetes Mellitus II Maternal Grandmother    Heart disease Maternal Grandmother 74   Other Maternal Grandmother        Myocardial infarction old/healed   Diabetes Mellitus  II Maternal Grandfather    Prostate cancer Paternal Grandfather 70   Kidney cancer Neg Hx    Breast cancer Neg Hx     Social History:  reports that she has never smoked. She has never used smokeless tobacco. She reports that she does not drink alcohol and does not use drugs.  ROS:                                        Physical Exam: BP 122/73   Pulse 81   Ht 5\' 3"  (1.6 m)   Wt 53.1 kg   LMP 09/29/2020   BMI 20.73 kg/m   Constitutional:  Alert and oriented, No acute distress. HEENT: [place] AT, moist mucus membranes.  Trachea midline, no masses. Cardiovascular: No clubbing, cyanosis, or edema. Respiratory: Normal respiratory effort, no increased work of breathing. GI: Abdomen is soft, nontender, nondistended, no abdominal masses GU: Mild hypermobility the  bladder neck.  No prolapse.  No trigger point Skin: No rashes, bruises or suspicious lesions. Lymph: No cervical or inguinal adenopathy. Neurologic: Grossly intact, no focal deficits, moving all 4 extremities. Psychiatric: Normal mood and affect.  Laboratory Data: No results found for: WBC, HGB, HCT, MCV, PLT  Lab Results  Component Value Date   CREATININE 0.67 10/04/2020    No results found for: PSA  No results found for: TESTOSTERONE  Lab Results  Component Value Date   HGBA1C 5.2 10/09/2016    Urinalysis    Component Value Date/Time   APPEARANCEUR Clear 11/28/2014 1127   GLUCOSEU Negative 11/28/2014 1127   BILIRUBINUR neg 09/11/2020 1054   BILIRUBINUR Negative 11/28/2014 1127   PROTEINUR Negative 09/11/2020 1054   PROTEINUR Negative 11/28/2014 1127   NITRITE neg 09/11/2020 1054   NITRITE Negative 11/28/2014 1127   LEUKOCYTESUR Negative 09/11/2020 1054   LEUKOCYTESUR Negative 11/28/2014 1127    Pertinent Imaging: Urine reviewed.  Chart reviewed.  Urine sent for culture  Assessment & Plan: Patient has interstitial cystitis.  I do not think she needs a hydrodistention at this stage.  Primary symptom is a lot of frequency I will be treated with overactive bladder medication.  She is currently on the Oxytrol patch.  She will take Myrbetriq as combination treatment.  We can always stop the patch in future.  Reassess in 6 weeks.  Urine was negative but call if positive  1. Dysuria, likely Interstitial Cystitis  - Urinalysis, Complete   No follow-ups on file.  Reece Packer, MD  [facility] [address] [place], [place] [address] [phone]

## 2020-10-25 ENCOUNTER — Ambulatory Visit (INDEPENDENT_AMBULATORY_CARE_PROVIDER_SITE_OTHER): Payer: BC Managed Care – PPO

## 2020-10-25 ENCOUNTER — Other Ambulatory Visit: Payer: Self-pay

## 2020-10-25 DIAGNOSIS — N83291 Other ovarian cyst, right side: Secondary | ICD-10-CM

## 2020-10-25 LAB — CULTURE, URINE COMPREHENSIVE

## 2020-10-29 ENCOUNTER — Telehealth: Payer: Self-pay | Admitting: Obstetrics and Gynecology

## 2020-10-29 ENCOUNTER — Ambulatory Visit: Payer: BLUE CROSS/BLUE SHIELD | Admitting: Urology

## 2020-10-29 NOTE — Telephone Encounter (Signed)
Pt aware of GYN u/s results. RTO cyst is now simple, no longer complex, per Vivien Rota, ultrasonographer (clarified with her). Nothing else to do at this time. F/u prn.

## 2020-11-07 ENCOUNTER — Ambulatory Visit: Payer: BC Managed Care – PPO

## 2020-11-07 DIAGNOSIS — N83291 Other ovarian cyst, right side: Secondary | ICD-10-CM

## 2020-12-03 ENCOUNTER — Encounter: Payer: Self-pay | Admitting: Urology

## 2020-12-03 ENCOUNTER — Ambulatory Visit (INDEPENDENT_AMBULATORY_CARE_PROVIDER_SITE_OTHER): Payer: BC Managed Care – PPO | Admitting: Urology

## 2020-12-03 ENCOUNTER — Other Ambulatory Visit: Payer: Self-pay

## 2020-12-03 VITALS — BP 137/88 | HR 94 | Ht 63.0 in | Wt 117.0 lb

## 2020-12-03 DIAGNOSIS — N3020 Other chronic cystitis without hematuria: Secondary | ICD-10-CM

## 2020-12-03 NOTE — Progress Notes (Signed)
12/03/2020 8:24 AM   Allison Lambert 23-Nov-1970 [id number]  Referring provider: Chad Cordial, PA-C [address] [place],  [place] [address]  Chief Complaint  Patient presents with   Follow-up    HPI: I was consulted to assess the patient for interstitial cystitis diagnosed many years ago.  She primarily manages that with the Oxytrol patch and also has overactive bladder.  Myrbetriq in the past has helped but was expensive.  She utilizes a diet  She was doing very well until August when on a bad day she would void 40 times a day with small-volume frequency voiding only very small amounts and would not feel empty.  Now she can void every 20 minutes or other times every 2 or 3 hours.  She gets up once or twice at night.  Flow varies and is volume dependent.  She might have a little bit of discomfort after she urinates but otherwise not a lot of pain.  She feels some fullness.  Primary care physician started her on Elmiron 1 month ago.  She takes Uribel.    Mild hypermobility the bladder neck.  No prolapse.  No trigger point  Patient has interstitial cystitis.  I do not think she needs a hydrodistention at this stage.  Primary symptom is a lot of frequency I will be treated with overactive bladder medication.  She is currently on the Oxytrol patch.  She will take Myrbetriq as combination treatment.  We can always stop the patch in future.  Reassess in 6 weeks.  Urine was negative but call if positive  Today Last culture negative Patient thinks it Myrbetriq may have helped or she just settled down spontaneously.  She is now just on the Oxytrol patch that she buys over-the-counter.  She will continue to take the Elmiron for 3 more months and decide with her primary care if she will stay on it.  Nighttime frequency stable.  No infections.      PMH: Past Medical History:  Diagnosis Date   Arthritis    Heart murmur    Mitral valve prolapse    OCD (obsessive compulsive  disorder)    Shingles     Surgical History: Past Surgical History:  Procedure Laterality Date   BREAST BIOPSY Left 09/21/2020   Sterei Bx, ribbon clip, path pending   COLONOSCOPY WITH PROPOFOL N/A 12/14/2019   Procedure: COLONOSCOPY WITH PROPOFOL;  Surgeon: Virgel Manifold, MD;  Location: [facility] ENDOSCOPY;  Service: Endoscopy;  Laterality: N/A;   DILATION AND CURETTAGE OF UTERUS  01/06/2002   KNEE ARTHROSCOPY  01/06/1985    Home Medications:  Allergies as of 12/03/2020   No Known Allergies      Medication List        Accurate as of December 03, 2020  8:24 AM. If you have any questions, ask your nurse or doctor.          STOP taking these medications    mirabegron ER 50 MG Tb24 tablet Commonly known as: MYRBETRIQ Stopped by: Reece Packer, MD       TAKE these medications    atorvastatin 10 MG tablet Commonly known as: LIPITOR Take 1 tablet (10 mg total) by mouth daily.   CALCIUM 1200+D3 PO   Elmiron 100 MG capsule Generic drug: pentosan polysulfate Take 1 capsule (100 mg total) by mouth 3 (three) times daily.   NON FORMULARY OXYBUTROL BLADDER PATCH PRN   omega-3 acid ethyl esters 1 g capsule Commonly known as: LOVAZA  Take by mouth 2 (two) times daily.   Uribel 118 MG Caps Take 1 capsule (118 mg total) by mouth 4 (four) times daily as needed.   Vitamin D (Cholecalciferol) 25 MCG (1000 UT) Caps Take by mouth.        Allergies: No Known Allergies  Family History: Family History  Problem Relation Age of Onset   Prostate cancer Father 28       stage 56   Bladder Cancer Father 73       Malignant   Alzheimer's disease Maternal Grandmother    Diabetes Mellitus II Maternal Grandmother    Heart disease Maternal Grandmother 74   Other Maternal Grandmother        Myocardial infarction old/healed   Diabetes Mellitus II Maternal Grandfather    Prostate cancer Paternal Grandfather 99   Kidney cancer Neg Hx    Breast cancer Neg Hx      Social History:  reports that she has never smoked. She has never used smokeless tobacco. She reports that she does not drink alcohol and does not use drugs.  ROS:                                        Physical Exam: BP 137/88   Pulse 94   Ht 5\' 3"  (1.6 m)   Wt 53.1 kg   BMI 20.73 kg/m   Constitutional:  Alert and oriented, No acute distress. HEENT: [place] AT, moist mucus membranes.  Trachea midline, no masses.   Laboratory Data: No results found for: WBC, HGB, HCT, MCV, PLT  Lab Results  Component Value Date   CREATININE 0.67 10/04/2020    No results found for: PSA  No results found for: TESTOSTERONE  Lab Results  Component Value Date   HGBA1C 5.2 10/09/2016    Urinalysis    Component Value Date/Time   APPEARANCEUR Clear 10/22/2020 0841   GLUCOSEU Negative 10/22/2020 0841   BILIRUBINUR Negative 10/22/2020 0841   PROTEINUR Negative 10/22/2020 0841   NITRITE Negative 10/22/2020 0841   LEUKOCYTESUR 1+ (A) 10/22/2020 0841    Pertinent Imaging:   Assessment & Plan: I will see her as needed  There are no diagnoses linked to this encounter.  No follow-ups on file.  Reece Packer, MD  [facility] [address] [place], [place] [address] [phone]

## 2021-03-22 ENCOUNTER — Ambulatory Visit
Admission: RE | Admit: 2021-03-22 | Discharge: 2021-03-22 | Disposition: A | Discharge status: Home / Self Care | Payer: BC Managed Care – PPO | Source: Ambulatory Visit | Attending: Obstetrics and Gynecology | Admitting: Obstetrics and Gynecology

## 2021-03-22 ENCOUNTER — Other Ambulatory Visit: Payer: Self-pay

## 2021-03-22 DIAGNOSIS — N6489 Other specified disorders of breast: Secondary | ICD-10-CM

## 2021-03-22 DIAGNOSIS — R922 Inconclusive mammogram: Secondary | ICD-10-CM | POA: Diagnosis not present

## 2021-03-22 DIAGNOSIS — R928 Other abnormal and inconclusive findings on diagnostic imaging of breast: Secondary | ICD-10-CM

## 2021-05-21 ENCOUNTER — Encounter: Payer: Self-pay | Admitting: Obstetrics and Gynecology

## 2021-05-21 ENCOUNTER — Ambulatory Visit (INDEPENDENT_AMBULATORY_CARE_PROVIDER_SITE_OTHER): Payer: BC Managed Care – PPO | Admitting: Obstetrics and Gynecology

## 2021-05-21 VITALS — BP 120/72 | Ht 63.0 in | Wt 125.0 lb

## 2021-05-21 DIAGNOSIS — R1031 Right lower quadrant pain: Secondary | ICD-10-CM

## 2021-05-21 DIAGNOSIS — N940 Mittelschmerz: Secondary | ICD-10-CM | POA: Diagnosis not present

## 2021-05-21 NOTE — Progress Notes (Signed)
Allison Lambert, Allison Lambert   Chief Complaint  Patient presents with   Pelvic Pain    Right side noticed since January 2023    HPI:      Allison Lambert is a 51 y.o. [id number] whose LMP was Patient's last menstrual period was 04/28/2021 (exact date)., presents today for episodic RLQ discomfort, usually mid cycle. Happened 1/23, 4/23 and then this cycle. Feels like ovulation pain in past, although sometimes lasting a little longer now. No meds needed. Pain not bad but pt wonders if she should be worried. Pt also had spotting after her 4-5 day menses 14-Mar-2022 and a late period 12-Apr-2022, but her sister died unexpectedly from cancer 03/14/2022, so more than likely stress related. Was having RLQ pain 9/22 and noted to have RT complex 2.4 cm ovar cyst that improved on f/u u/s. Those sx then resolved. Was also having IC flare at that time but those sx stable now.    Patient Active Problem List   Diagnosis Date Noted   Encounter for screening colonoscopy    Cyst of left ovary 09/22/2017   Hyperlipidemia 10/13/2016   Dysuria, likely Interstitial Cystitis 11/28/2014   Urinary urgency / frequency 11/28/2014    Past Surgical History:  Procedure Laterality Date   BREAST BIOPSY Left 09/21/2020   Sterei Bx, ribbon clip, neg   COLONOSCOPY WITH PROPOFOL N/A 12/14/2019   Procedure: COLONOSCOPY WITH PROPOFOL;  Surgeon: Virgel Manifold, MD;  Location: [facility] ENDOSCOPY;  Service: Endoscopy;  Laterality: N/A;   DILATION AND CURETTAGE OF UTERUS  01/06/2002   KNEE ARTHROSCOPY  01/06/1985    Family History  Problem Relation Age of Onset   Prostate cancer Father 26       stage 70   Bladder Cancer Father 62       Malignant   Cancer Sister        Metastatic Cancer   Alzheimer's disease Maternal Grandmother    Diabetes Mellitus II Maternal Grandmother    Heart disease Maternal Grandmother 74   Other Maternal Grandmother        Myocardial infarction old/healed   Diabetes Mellitus II Maternal Grandfather     Prostate cancer Paternal Grandfather 72   Kidney cancer Neg Hx    Breast cancer Neg Hx     Social History   Socioeconomic History   Marital status: Married    Spouse name: Not on file   Number of children: Not on file   Years of education: Not on file   Highest education level: Not on file  Occupational History   Not on file  Tobacco Use   Smoking status: Never   Smokeless tobacco: Never  Vaping Use   Vaping Use: Never used  Substance and Sexual Activity   Alcohol use: No    Alcohol/week: 0.0 standard drinks    Comment: a couple drinks a month    Drug use: No   Sexual activity: Yes    Birth control/protection: Condom  Other Topics Concern   Not on file  Social History Narrative   Not on file   Social Determinants of Health   Financial Resource Strain: Not on file  Food Insecurity: Not on file  Transportation Needs: Not on file  Physical Activity: Not on file  Stress: Not on file  Social Connections: Not on file  Intimate Partner Violence: Not on file    Outpatient Medications Prior to Visit  Medication Sig Dispense Refill   atorvastatin (LIPITOR) 10 MG  tablet Take 1 tablet (10 mg total) by mouth daily. 90 tablet 3   Calcium-Magnesium-Vitamin D (CALCIUM 1200+D3 PO)      Meth-Hyo-M Bl-Na Phos-Ph Sal (URIBEL) 118 MG CAPS Take 1 capsule (118 mg total) by mouth 4 (four) times daily as needed. 30 capsule 0   NON FORMULARY OXYBUTROL BLADDER PATCH PRN     omega-3 acid ethyl esters (LOVAZA) 1 G capsule Take by mouth 2 (two) times daily.     Vitamin D, Cholecalciferol, 1000 UNITS CAPS Take by mouth.     pentosan polysulfate (ELMIRON) 100 MG capsule Take 1 capsule (100 mg total) by mouth 3 (three) times daily. 90 capsule 3   No facility-administered medications prior to visit.      ROS:  Review of Systems  Constitutional:  Negative for fever.  Gastrointestinal:  Negative for blood in stool, constipation, diarrhea, nausea and vomiting.  Genitourinary:  Positive for  pelvic pain. Negative for dyspareunia, dysuria, flank pain, frequency, hematuria, urgency, vaginal bleeding, vaginal discharge and vaginal pain.  Musculoskeletal:  Negative for back pain.  Skin:  Negative for rash.  BREAST: No symptoms   OBJECTIVE:   Vitals:  BP 120/72   Ht '5\' 3"'$  (1.6 m)   Wt 125 lb (56.7 kg)   LMP 04/28/2021 (Exact Date)   BMI 22.14 kg/m   Physical Exam Vitals reviewed.  Constitutional:      Appearance: She is well-developed.  Pulmonary:     Effort: Pulmonary effort is normal.  Abdominal:     Palpations: Abdomen is soft.     Tenderness: There is no abdominal tenderness.  Genitourinary:    General: Normal vulva.     Pubic Area: No rash.      Labia:        Right: No rash, tenderness or lesion.        Left: No rash, tenderness or lesion.      Vagina: Normal. No vaginal discharge, erythema or tenderness.     Cervix: Normal.     Uterus: Normal. Not enlarged and not tender.      Adnexa: Right adnexa normal and left adnexa normal.       Right: No mass or tenderness.         Left: No mass or tenderness.    Musculoskeletal:        General: Normal range of motion.     Cervical back: Normal range of motion.  Skin:    General: Skin is warm and dry.  Neurological:     General: No focal deficit present.     Mental Status: She is alert and oriented to person, place, and time.  Psychiatric:        Mood and Affect: Mood normal.        Behavior: Behavior normal.        Thought Content: Thought content normal.        Judgment: Judgment normal.    Assessment/Plan:  RLQ abdominal pain--Neg exam, reassurance. Had simple RTO cyst on 11/22 GYN u/s, no further imaging needed. Most likely ovulatory pain. F/u if sx persist/worsen for repeat GYN u/s. Pt ok to watch and wait for now.   [place]     Return if symptoms worsen or fail to improve.  Allison Lambert, Allison Lambert 05/21/2021 2:20 PM

## 2021-05-21 NOTE — Patient Instructions (Signed)
I value your feedback and you entrusting us with your care. If you get a [facility] patient survey, I would appreciate you taking the time to let us know about your experience today. Thank you! ? ? ?

## 2021-07-31 ENCOUNTER — Other Ambulatory Visit: Payer: Self-pay | Admitting: Obstetrics and Gynecology

## 2021-07-31 DIAGNOSIS — Z1231 Encounter for screening mammogram for malignant neoplasm of breast: Secondary | ICD-10-CM

## 2021-09-10 ENCOUNTER — Ambulatory Visit
Admission: RE | Admit: 2021-09-10 | Discharge: 2021-09-10 | Disposition: A | Discharge status: Home / Self Care | Payer: BC Managed Care – PPO | Source: Ambulatory Visit | Attending: Obstetrics and Gynecology | Admitting: Obstetrics and Gynecology

## 2021-09-10 DIAGNOSIS — Z1231 Encounter for screening mammogram for malignant neoplasm of breast: Secondary | ICD-10-CM | POA: Diagnosis not present

## 2021-10-10 ENCOUNTER — Other Ambulatory Visit: Payer: Self-pay | Admitting: Obstetrics and Gynecology

## 2021-10-23 NOTE — Progress Notes (Unsigned)
PCP:  Chad Cordial, PA-C   No chief complaint on file.    HPI:      [name] is a 51 y.o. [id number] who LMP was No LMP recorded., presents today for her annual examination.  Her menses are regular every 24-27 days, lasting 3-5 days. Has n/v and hot flashes first day of menses, but had similar sx in adolescence. Dysmenorrhea mild, improved with NSAIDs. Had intermenstrual bleeding 7/22 and GYN u/s showed bilat ovar cysts. Sx since resolved. Also had RLQ pain, question related to IC flare vs bilat ovar cysts.   Sex activity: single partner, contraception - condoms most of the time and rhythm method.  Last Pap: 10/04/20 Results were: no abnormalities /neg HPV DNA  Hx of STDs: none  Had RLQ pain since 7/22; GYN u/s showed bilat ovar cysts, but RTO has 2.4 mildly complex cyst requiring 8 wk u/s f/u. Has appt 11/07/20. RLQ pain improved.  Also had IC flare with pain, started on elmiron 9/22 since IC diet not helping. Sx getting a little better but still having urinary frequency/urgency. Has urology appt in a few wks.   Last mammogram: 09/10/21  Results were: normal, repeat in 12 months. Hx of cat 4 LT breast 9/22; bx showed PASH  There is no FH of breast cancer. There is no FH of ovarian cancer. The patient does do self-breast exams. There is a FH met bladder cancer in her dad, and non-aggressive prostate cancer in her PGF. Will follow FH for genetic testing.   Tobacco use: The patient denies current or previous tobacco use. Alcohol use: social drinker No drug use.  Exercise: very active  Colonoscopy: 12/21 at [facility],  repeat due after 10 yrs  She does get adequate calcium and Vitamin D in her diet.  She had elevated lipids 10/18 and was started on lipitor 10 mg daily.  No side effects. Due for labs. Doing well.   Past Medical History:  Diagnosis Date   Arthritis    Heart murmur    Mitral valve prolapse    OCD (obsessive compulsive disorder)    Shingles     Past  Surgical History:  Procedure Laterality Date   BREAST BIOPSY Left 09/21/2020   Sterei Bx, ribbon clip, neg   COLONOSCOPY WITH PROPOFOL N/A 12/14/2019   Procedure: COLONOSCOPY WITH PROPOFOL;  Surgeon: Virgel Manifold, MD;  Location: [facility] ENDOSCOPY;  Service: Endoscopy;  Laterality: N/A;   DILATION AND CURETTAGE OF UTERUS  01/06/2002   KNEE ARTHROSCOPY  01/06/1985    Family History  Problem Relation Age of Onset   Prostate cancer Father 42       stage 55   Bladder Cancer Father 26       Malignant   Cancer Sister        Metastatic Cancer   Alzheimer's disease Maternal Grandmother    Diabetes Mellitus II Maternal Grandmother    Heart disease Maternal Grandmother 74   Other Maternal Grandmother        Myocardial infarction old/healed   Diabetes Mellitus II Maternal Grandfather    Prostate cancer Paternal Grandfather 75   Kidney cancer Neg Hx    Breast cancer Neg Hx     Social History   Socioeconomic History   Marital status: Married    Spouse name: Not on file   Number of children: Not on file   Years of education: Not on file   Highest education level: Not on file  Occupational History   Not on file  Tobacco Use   Smoking status: Never   Smokeless tobacco: Never  Vaping Use   Vaping Use: Never used  Substance and Sexual Activity   Alcohol use: No    Alcohol/week: 0.0 standard drinks of alcohol    Comment: a couple drinks a month    Drug use: No   Sexual activity: Yes    Birth control/protection: Condom  Other Topics Concern   Not on file  Social History Narrative   Not on file   Social Determinants of Health   Financial Resource Strain: Not on file  Food Insecurity: Not on file  Transportation Needs: Not on file  Physical Activity: Not on file  Stress: Not on file  Social Connections: Not on file  Intimate Partner Violence: Not on file    No outpatient medications have been marked as taking for the 10/24/21 encounter (Appointment) with Naina Sleeper,  [name], PA-C.     ROS:  Review of Systems  Constitutional:  Negative for fatigue, fever and unexpected weight change.  Respiratory:  Negative for cough, shortness of breath and wheezing.   Cardiovascular:  Negative for chest pain, palpitations and leg swelling.  Gastrointestinal:  Negative for blood in stool, constipation, diarrhea, nausea and vomiting.  Endocrine: Negative for cold intolerance, heat intolerance and polyuria.  Genitourinary:  Positive for frequency. Negative for dyspareunia, dysuria, flank pain, genital sores, hematuria, menstrual problem, pelvic pain, urgency, vaginal bleeding, vaginal discharge and vaginal pain.  Musculoskeletal:  Negative for arthralgias, back pain, joint swelling and myalgias.  Skin:  Negative for rash.  Neurological:  Negative for dizziness, syncope, light-headedness, numbness and headaches.  Hematological:  Negative for adenopathy.  Psychiatric/Behavioral:  Negative for agitation, confusion, sleep disturbance and suicidal ideas. The patient is not nervous/anxious.      Objective: There were no vitals taken for this visit.   Physical Exam Constitutional:      Appearance: She is well-developed.  Genitourinary:     Vulva normal.     Right Labia: No rash, tenderness or lesions.    Left Labia: No tenderness, lesions or rash.    No vaginal discharge, erythema or tenderness.      Right Adnexa: not tender and no mass present.    Left Adnexa: not tender and no mass present.    No cervical motion tenderness, friability or polyp.     Uterus is not enlarged or tender.  Breasts:    Right: No mass, nipple discharge, skin change or tenderness.     Left: No mass, nipple discharge, skin change or tenderness.  Neck:     Thyroid: No thyromegaly.  Cardiovascular:     Rate and Rhythm: Normal rate and regular rhythm.     Heart sounds: Normal heart sounds. No murmur heard. Pulmonary:     Effort: Pulmonary effort is normal.     Breath sounds: Normal  breath sounds.  Abdominal:     Palpations: Abdomen is soft.     Tenderness: There is no abdominal tenderness. There is no guarding or rebound.  Musculoskeletal:        General: Normal range of motion.     Cervical back: Normal range of motion.  Lymphadenopathy:     Cervical: No cervical adenopathy.  Neurological:     General: No focal deficit present.     Mental Status: She is alert and oriented to person, place, and time.     Cranial Nerves: No cranial nerve deficit.  Skin:  General: Skin is warm and dry.  Psychiatric:        Mood and Affect: Mood normal.        Behavior: Behavior normal.        Thought Content: Thought content normal.        Judgment: Judgment normal.  Vitals reviewed.     Assessment/Plan: Encounter for annual routine gynecological examination  Cervical cancer screening - Plan: Cytology - PAP  Screening for HPV (human papillomavirus) - Plan: Cytology - PAP  IC (interstitial cystitis) - Plan: Meth-Hyo-M Bl-Na Phos-Ph Sal (URIBEL) 118 MG CAPS; sx improving with elmiron and IC diet, will add uribel prn frequency. Has urology appt 10/22.  Abnormal mammogram of left breast--pt to sched f/u imaging for 3/23  Blood tests for routine general physical examination - Plan: Comprehensive metabolic panel, Lipid panel  Mixed hyperlipidemia - Plan: Lipid panel; will RF meds based on results.   Right ovarian cyst--has u/s f/u 11/22. Sx improved.   No orders of the defined types were placed in this encounter.   GYN counsel breast self exam, mammography screening, adequate intake of calcium and vitamin D, diet and exercise     F/U  No follow-ups on file.  Aubriee Szeto B. Ravan Schlemmer, PA-C 10/23/2021 3:02 PM

## 2021-10-24 ENCOUNTER — Encounter: Payer: Self-pay | Admitting: Obstetrics and Gynecology

## 2021-10-24 ENCOUNTER — Ambulatory Visit (INDEPENDENT_AMBULATORY_CARE_PROVIDER_SITE_OTHER): Payer: BC Managed Care – PPO | Admitting: Obstetrics and Gynecology

## 2021-10-24 VITALS — BP 100/70 | Ht 63.0 in | Wt 124.0 lb

## 2021-10-24 DIAGNOSIS — Z01411 Encounter for gynecological examination (general) (routine) with abnormal findings: Secondary | ICD-10-CM

## 2021-10-24 DIAGNOSIS — E782 Mixed hyperlipidemia: Secondary | ICD-10-CM

## 2021-10-24 DIAGNOSIS — Z0000 Encounter for general adult medical examination without abnormal findings: Secondary | ICD-10-CM

## 2021-10-24 DIAGNOSIS — N940 Mittelschmerz: Secondary | ICD-10-CM

## 2021-10-24 DIAGNOSIS — Z1231 Encounter for screening mammogram for malignant neoplasm of breast: Secondary | ICD-10-CM

## 2021-10-24 DIAGNOSIS — N3010 Interstitial cystitis (chronic) without hematuria: Secondary | ICD-10-CM | POA: Diagnosis not present

## 2021-10-24 DIAGNOSIS — Z01419 Encounter for gynecological examination (general) (routine) without abnormal findings: Secondary | ICD-10-CM

## 2021-10-24 MED ORDER — URIBEL 118 MG PO CAPS: 118.0000 mg | ORAL_CAPSULE | Freq: Four times a day (QID) | ORAL | 1 refills | Status: DC | PRN

## 2021-10-24 NOTE — Patient Instructions (Signed)
I value your feedback and you entrusting us with your care. If you get a [facility] patient survey, I would appreciate you taking the time to let us know about your experience today. Thank you! ? ? ?

## 2021-10-25 LAB — LIPID PANEL
Chol/HDL Ratio: 2.6 ratio (ref 0.0–4.4)
Cholesterol, Total: 153 mg/dL (ref 100–199)
HDL: 59 mg/dL (ref >39)
LDL Chol Calc (NIH): 80 mg/dL (ref 0–99)
Triglycerides: 69 mg/dL (ref 0–149)
VLDL Cholesterol Cal: 14 mg/dL (ref 5–40)

## 2021-10-25 LAB — COMPREHENSIVE METABOLIC PANEL
ALT: 10 IU/L (ref 0–32)
AST: 18 IU/L (ref 0–40)
Albumin/Globulin Ratio: 2.1 (ref 1.2–2.2)
Albumin: 4.8 g/dL (ref 3.8–4.9)
Alkaline Phosphatase: 58 IU/L (ref 44–121)
BUN/Creatinine Ratio: 24 — ABNORMAL HIGH (ref 9–23)
BUN: 16 mg/dL (ref 6–24)
Bilirubin Total: 0.4 mg/dL (ref 0.0–1.2)
CO2: 22 mmol/L (ref 20–29)
Calcium: 9.3 mg/dL (ref 8.7–10.2)
Chloride: 103 mmol/L (ref 96–106)
Creatinine, Ser: 0.67 mg/dL (ref 0.57–1.00)
Globulin, Total: 2.3 g/dL (ref 1.5–4.5)
Glucose: 97 mg/dL (ref 70–99)
Potassium: 4.5 mmol/L (ref 3.5–5.2)
Sodium: 139 mmol/L (ref 134–144)
Total Protein: 7.1 g/dL (ref 6.0–8.5)
eGFR: 105 mL/min/{1.73_m2} (ref >59)

## 2021-10-29 MED ORDER — ATORVASTATIN CALCIUM 10 MG PO TABS: 5.0000 mg | ORAL_TABLET | Freq: Every day | ORAL | 0 refills | Status: DC

## 2021-10-29 NOTE — Addendum Note (Signed)
Addended by: Ardeth Perfect B on: 92/95/7473 05:21 PM   Modules accepted: Orders

## 2021-10-29 NOTE — Addendum Note (Signed)
Addended by: Ardeth Perfect B on: 05/30/9100 05:22 PM   Modules accepted: Orders

## 2021-12-19 ENCOUNTER — Encounter: Payer: Self-pay | Admitting: Obstetrics and Gynecology

## 2021-12-19 ENCOUNTER — Ambulatory Visit (INDEPENDENT_AMBULATORY_CARE_PROVIDER_SITE_OTHER): Payer: BC Managed Care – PPO | Admitting: Obstetrics and Gynecology

## 2021-12-19 VITALS — BP 120/64 | Ht 63.0 in | Wt 129.0 lb

## 2021-12-19 DIAGNOSIS — Z01419 Encounter for gynecological examination (general) (routine) without abnormal findings: Secondary | ICD-10-CM

## 2021-12-19 NOTE — Progress Notes (Signed)
Kedarius Aloisi, Allison Evener, PA-C   Chief Complaint  Patient presents with   Vaginal Exam    Internal knot, no pain    HPI:      Allison Lambert is a 51 y.o. [id number] whose LMP was Patient's last menstrual period was 11/29/2021 (exact date)., presents today for knot inside vagina, posterior wall, noticed when applying lubricant a few days ago, still there. Feels hard, but no pain/tenderness. No BTB. Hx of traumatic SVD many yrs ago and has had hemorrhoids since. Occas has flare. No rectal pain/bleeding/blood in stool with recent sx. Does get constipation sometimes. No dyspareunia.    Patient Active Problem List   Diagnosis Date Noted   Encounter for screening colonoscopy    Cyst of left ovary 09/22/2017   Hyperlipidemia 10/13/2016   Dysuria, likely Interstitial Cystitis 11/28/2014   Urinary urgency / frequency 11/28/2014    Past Surgical History:  Procedure Laterality Date   BREAST BIOPSY Left 09/21/2020   Sterei Bx, ribbon clip, neg   COLONOSCOPY WITH PROPOFOL N/A 12/14/2019   Procedure: COLONOSCOPY WITH PROPOFOL;  Surgeon: Virgel Manifold, MD;  Location: [facility] ENDOSCOPY;  Service: Endoscopy;  Laterality: N/A;   DILATION AND CURETTAGE OF UTERUS  01/06/2002   KNEE ARTHROSCOPY  01/06/1985    Family History  Problem Relation Age of Onset   Prostate cancer Father 49       stage 62   Bladder Cancer Father 4       Malignant   Cancer Sister        Metastatic Cancer   Alzheimer's disease Maternal Grandmother    Diabetes Mellitus II Maternal Grandmother    Heart disease Maternal Grandmother 74   Other Maternal Grandmother        Myocardial infarction old/healed   Diabetes Mellitus II Maternal Grandfather    Prostate cancer Paternal Grandfather 57   Kidney cancer Neg Hx    Breast cancer Neg Hx     Social History   Socioeconomic History   Marital status: Married    Spouse name: Not on file   Number of children: Not on file   Years of education: Not on file    Highest education level: Not on file  Occupational History   Not on file  Tobacco Use   Smoking status: Never   Smokeless tobacco: Never  Vaping Use   Vaping Use: Never used  Substance and Sexual Activity   Alcohol use: No    Alcohol/week: 0.0 standard drinks of alcohol    Comment: a couple drinks a month    Drug use: No   Sexual activity: Yes    Birth control/protection: Condom  Other Topics Concern   Not on file  Social History Narrative   Not on file   Social Determinants of Health   Financial Resource Strain: Not on file  Food Insecurity: Not on file  Transportation Needs: Not on file  Physical Activity: Not on file  Stress: Not on file  Social Connections: Not on file  Intimate Partner Violence: Not on file    Outpatient Medications Prior to Visit  Medication Sig Dispense Refill   atorvastatin (LIPITOR) 10 MG tablet Take 0.5 tablets (5 mg total) by mouth daily. 90 tablet 0   Calcium-Magnesium-Vitamin D (CALCIUM 1200+D3 PO)      Meth-Hyo-M Bl-Na Phos-Ph Sal (URIBEL) 118 MG CAPS Take 1 capsule (118 mg total) by mouth 4 (four) times daily as needed. 30 capsule 1   NON FORMULARY OXYBUTROL  BLADDER PATCH PRN     omega-3 acid ethyl esters (LOVAZA) 1 G capsule Take by mouth 2 (two) times daily.     Vitamin D, Cholecalciferol, 1000 UNITS CAPS Take by mouth.     No facility-administered medications prior to visit.      ROS:  Review of Systems  Constitutional:  Negative for fever.  Gastrointestinal:  Positive for constipation. Negative for blood in stool, diarrhea, nausea and vomiting.  Genitourinary:  Negative for dyspareunia, dysuria, flank pain, frequency, hematuria, urgency, vaginal bleeding, vaginal discharge and vaginal pain.  Musculoskeletal:  Negative for back pain.  Skin:  Negative for rash.   BREAST: No symptoms   OBJECTIVE:   Vitals:  BP 120/64   Ht '5\' 3"'$  (1.6 m)   Wt 129 lb (58.5 kg)   LMP 11/29/2021 (Exact Date)   BMI 22.85 kg/m   Physical  Exam Vitals reviewed.  Constitutional:      Appearance: She is well-developed.  Pulmonary:     Effort: Pulmonary effort is normal.  Genitourinary:    General: Normal vulva.     Pubic Area: No rash.      Labia:        Right: No rash, tenderness or lesion.        Left: No rash, tenderness or lesion.      Vagina: Normal. No vaginal discharge, erythema or tenderness.     Cervix: Normal.     Uterus: Normal. Not enlarged and not tender.      Adnexa: Right adnexa normal and left adnexa normal.       Right: No mass or tenderness.         Left: No mass or tenderness.         Comments: POST WALL WITH FIRM LINEAR AREA WITH 2 SMALL "BULBS" INFERIORLY; NT TO PALPATE; FEELS LIKE LIGAMENTS/MSK Musculoskeletal:        General: Normal range of motion.     Cervical back: Normal range of motion.  Skin:    General: Skin is warm and dry.  Neurological:     General: No focal deficit present.     Mental Status: She is alert and oriented to person, place, and time.  Psychiatric:        Mood and Affect: Mood normal.        Behavior: Behavior normal.        Thought Content: Thought content normal.        Judgment: Judgment normal.    Assessment/Plan: Normal vaginal exam-- pt concerned about nodule post vag wall. Feels MSK like ligaments/muscle (after pt left, I felt it on another older pt). Discussed with Dr. Marcelline Mates who agrees since symmetrical, could be MSK. Pt aware; f/u prn any changes. Can do either u/s or CT to eval further if needed.    Return if symptoms worsen or fail to improve.  Alexsander Cavins B. Artis Beggs, PA-C 12/23/2021 5:37 PM

## 2021-12-23 ENCOUNTER — Encounter: Payer: Self-pay | Admitting: Obstetrics and Gynecology

## 2022-01-08 ENCOUNTER — Other Ambulatory Visit: Payer: Self-pay | Admitting: Obstetrics and Gynecology

## 2022-01-08 DIAGNOSIS — E782 Mixed hyperlipidemia: Secondary | ICD-10-CM

## 2022-02-14 ENCOUNTER — Other Ambulatory Visit: Payer: BC Managed Care – PPO

## 2022-02-14 DIAGNOSIS — E782 Mixed hyperlipidemia: Secondary | ICD-10-CM

## 2022-02-15 LAB — LIPID PANEL
Chol/HDL Ratio: 2.5 ratio (ref 0.0–4.4)
Cholesterol, Total: 187 mg/dL (ref 100–199)
HDL: 76 mg/dL (ref >39)
LDL Chol Calc (NIH): 101 mg/dL — ABNORMAL HIGH (ref 0–99)
Triglycerides: 52 mg/dL (ref 0–149)
VLDL Cholesterol Cal: 10 mg/dL (ref 5–40)

## 2022-02-27 ENCOUNTER — Encounter: Payer: Self-pay | Admitting: Obstetrics and Gynecology

## 2022-02-27 ENCOUNTER — Ambulatory Visit (INDEPENDENT_AMBULATORY_CARE_PROVIDER_SITE_OTHER): Payer: BC Managed Care – PPO | Admitting: Obstetrics and Gynecology

## 2022-02-27 VITALS — BP 130/80 | Wt 127.0 lb

## 2022-02-27 DIAGNOSIS — N926 Irregular menstruation, unspecified: Secondary | ICD-10-CM

## 2022-02-27 NOTE — Progress Notes (Signed)
Allison Lambert, Allison Evener, PA-C   Chief Complaint  Patient presents with   Menstrual Problem    Period started on 1/13 lasting 7 days, then  on 1/30 lasting 7 days then again on  2/19 lasted 4 days,     HPI:      Allison Lambert is a 52 y.o. [id number] whose LMP was Patient's last menstrual period was 02/24/2022., presents today for irregular menses since 1/24. Her menses are Q23-28 days usually, lasting 4-5 days, mod flow. Dysmenorrhea mild, improved with NSAIDs. Menses started on time 01/18/22 and was normal; had some significant RLQ pain before menses; hx of ovar cysts in past. Then started period again 17 days later, normal flow; with increased hot flashes/breast tenderness at the time. Period then started again 21 days later 02/24/22, again normal flow.  Had GYN u/s 9/22 for RLQ pain with EM=7 mm, small leio 1.3 cm, and RTO cyst.  Neg pap 9/22; annual done 10/23 and pelvic exam done 12/23.   Patient Active Problem List   Diagnosis Date Noted   Encounter for screening colonoscopy    Cyst of left ovary 09/22/2017   Hyperlipidemia 10/13/2016   Dysuria, likely Interstitial Cystitis 11/28/2014   Urinary urgency / frequency 11/28/2014    Past Surgical History:  Procedure Laterality Date   BREAST BIOPSY Left 09/21/2020   Sterei Bx, ribbon clip, neg   COLONOSCOPY WITH PROPOFOL N/A 12/14/2019   Procedure: COLONOSCOPY WITH PROPOFOL;  Surgeon: Virgel Manifold, MD;  Location: [facility] ENDOSCOPY;  Service: Endoscopy;  Laterality: N/A;   DILATION AND CURETTAGE OF UTERUS  01/06/2002   KNEE ARTHROSCOPY  01/06/1985    Family History  Problem Relation Age of Onset   Prostate cancer Father 69       stage 13   Bladder Cancer Father 24       Malignant   Cancer Sister        Metastatic Cancer   Alzheimer's disease Maternal Grandmother    Diabetes Mellitus II Maternal Grandmother    Heart disease Maternal Grandmother 74   Other Maternal Grandmother        Myocardial infarction old/healed    Diabetes Mellitus II Maternal Grandfather    Prostate cancer Paternal Grandfather 43   Kidney cancer Neg Hx    Breast cancer Neg Hx     Social History   Socioeconomic History   Marital status: Married    Spouse name: Not on file   Number of children: Not on file   Years of education: Not on file   Highest education level: Not on file  Occupational History   Not on file  Tobacco Use   Smoking status: Never   Smokeless tobacco: Never  Vaping Use   Vaping Use: Never used  Substance and Sexual Activity   Alcohol use: No    Alcohol/week: 0.0 standard drinks of alcohol    Comment: a couple drinks a month    Drug use: No   Sexual activity: Yes    Birth control/protection: Condom  Other Topics Concern   Not on file  Social History Narrative   Not on file   Social Determinants of Health   Financial Resource Strain: Not on file  Food Insecurity: Not on file  Transportation Needs: Not on file  Physical Activity: Not on file  Stress: Not on file  Social Connections: Not on file  Intimate Partner Violence: Not on file    Outpatient Medications Prior to Visit  Medication  Sig Dispense Refill   Calcium-Magnesium-Vitamin D (CALCIUM 1200+D3 PO)      Meth-Hyo-M Bl-Na Phos-Ph Sal (URIBEL) 118 MG CAPS Take 1 capsule (118 mg total) by mouth 4 (four) times daily as needed. 30 capsule 1   omega-3 acid ethyl esters (LOVAZA) 1 G capsule Take by mouth 2 (two) times daily.     Vitamin D, Cholecalciferol, 1000 UNITS CAPS Take by mouth.     atorvastatin (LIPITOR) 10 MG tablet Take 0.5 tablets (5 mg total) by mouth daily. 90 tablet 0   NON FORMULARY OXYBUTROL BLADDER PATCH PRN     No facility-administered medications prior to visit.      ROS:  Review of Systems  Constitutional:  Negative for fever.  Gastrointestinal:  Negative for blood in stool, constipation, diarrhea, nausea and vomiting.  Genitourinary:  Positive for menstrual problem. Negative for dyspareunia, dysuria, flank pain,  frequency, hematuria, urgency, vaginal bleeding, vaginal discharge and vaginal pain.  Musculoskeletal:  Negative for back pain.  Skin:  Negative for rash.   BREAST: No symptoms   OBJECTIVE:   Vitals:  BP 130/80   Wt 127 lb (57.6 kg)   LMP 02/24/2022   BMI 22.50 kg/m   Physical Exam Vitals reviewed.  Constitutional:      Appearance: She is well-developed.  Pulmonary:     Effort: Pulmonary effort is normal.  Musculoskeletal:        General: Normal range of motion.     Cervical back: Normal range of motion.  Skin:    General: Skin is warm and dry.  Neurological:     General: No focal deficit present.     Mental Status: She is alert and oriented to person, place, and time.     Cranial Nerves: No cranial nerve deficit.  Psychiatric:        Mood and Affect: Mood normal.        Behavior: Behavior normal.        Thought Content: Thought content normal.        Judgment: Judgment normal.     Assessment/Plan: Irregular menses--since 1/24 although close to normal cycles. Discussed further eval vs follow expectantly. Most likely due to perimenopausal hormonal changes. Pt would like to watch and wait to see what next cycle does. If cont to be too early, will check labs/GYN u/s. F/u prn.     Return if symptoms worsen or fail to improve.  Chon Buhl B. Oceana Walthall, PA-C 02/27/2022 5:21 PM

## 2022-07-19 ENCOUNTER — Ambulatory Visit: Payer: BC Managed Care – PPO

## 2022-07-19 DIAGNOSIS — S92425A Nondisplaced fracture of distal phalanx of left great toe, initial encounter for closed fracture: Secondary | ICD-10-CM | POA: Diagnosis not present

## 2022-08-04 DIAGNOSIS — S92425D Nondisplaced fracture of distal phalanx of left great toe, subsequent encounter for fracture with routine healing: Secondary | ICD-10-CM | POA: Diagnosis not present

## 2022-08-05 ENCOUNTER — Other Ambulatory Visit: Payer: Self-pay | Admitting: Obstetrics and Gynecology

## 2022-08-05 DIAGNOSIS — Z1231 Encounter for screening mammogram for malignant neoplasm of breast: Secondary | ICD-10-CM

## 2022-08-25 ENCOUNTER — Encounter: Payer: Self-pay | Admitting: Obstetrics and Gynecology

## 2022-08-25 DIAGNOSIS — N939 Abnormal uterine and vaginal bleeding, unspecified: Secondary | ICD-10-CM

## 2022-08-25 DIAGNOSIS — R1031 Right lower quadrant pain: Secondary | ICD-10-CM

## 2022-08-26 ENCOUNTER — Telehealth: Payer: Self-pay | Admitting: Obstetrics and Gynecology

## 2022-08-26 ENCOUNTER — Ambulatory Visit: Payer: BC Managed Care – PPO | Admitting: Obstetrics and Gynecology

## 2022-08-26 DIAGNOSIS — Z1329 Encounter for screening for other suspected endocrine disorder: Secondary | ICD-10-CM

## 2022-08-26 DIAGNOSIS — N939 Abnormal uterine and vaginal bleeding, unspecified: Secondary | ICD-10-CM

## 2022-08-26 NOTE — Telephone Encounter (Signed)
Lab order and GYN u/s for AUB/RLQ pain.

## 2022-08-27 ENCOUNTER — Ambulatory Visit
Admission: RE | Admit: 2022-08-27 | Discharge: 2022-08-27 | Disposition: A | Discharge status: Home / Self Care | Payer: BC Managed Care – PPO | Source: Ambulatory Visit | Attending: Obstetrics and Gynecology | Admitting: Obstetrics and Gynecology

## 2022-08-27 ENCOUNTER — Other Ambulatory Visit: Payer: BC Managed Care – PPO

## 2022-08-27 DIAGNOSIS — N939 Abnormal uterine and vaginal bleeding, unspecified: Secondary | ICD-10-CM | POA: Diagnosis not present

## 2022-08-27 DIAGNOSIS — Z1329 Encounter for screening for other suspected endocrine disorder: Secondary | ICD-10-CM

## 2022-08-27 DIAGNOSIS — R1031 Right lower quadrant pain: Secondary | ICD-10-CM | POA: Insufficient documentation

## 2022-08-27 DIAGNOSIS — D259 Leiomyoma of uterus, unspecified: Secondary | ICD-10-CM | POA: Diagnosis not present

## 2022-08-28 LAB — T4, FREE: Free T4: 1.16 ng/dL (ref 0.82–1.77)

## 2022-08-28 LAB — TSH: TSH: 0.983 u[IU]/mL (ref 0.450–4.500)

## 2022-09-16 ENCOUNTER — Ambulatory Visit
Admission: RE | Admit: 2022-09-16 | Discharge: 2022-09-16 | Disposition: A | Discharge status: Home / Self Care | Payer: BC Managed Care – PPO | Source: Ambulatory Visit | Attending: Obstetrics and Gynecology | Admitting: Obstetrics and Gynecology

## 2022-09-16 DIAGNOSIS — Z1231 Encounter for screening mammogram for malignant neoplasm of breast: Secondary | ICD-10-CM | POA: Diagnosis not present

## 2022-10-27 NOTE — Progress Notes (Unsigned)
PCP:  Rica Records, PA-C   No chief complaint on file.    HPI:      [name] is a 52 y.o. [id number] who LMP was No LMP recorded., presents today for her annual examination.  Her menses are Q23-28 days, lasting 3-4 days (has been late for 1 period this yr). Dysmenorrhea mild, improved with NSAIDs. Gets occas RLQ pain mid cycle, most likely c/w mittelschmerz. Hx of ovar cysts. Does have vasomotor sx.  8/24 GYN u/s with EM=3 mm; Anterior intramural fibroid measures 2.9 cm. Posterior subserosal fibroid measures 1.3 cm. Additional intramural fibroids measure approximately 1 cm.  2/24 irregular menses since 1/24. Her menses are Q23-28 days usually, lasting 4-5 days, mod flow. Dysmenorrhea mild, improved with NSAIDs. Menses started on time 01/18/22 and was normal; had some significant RLQ pain before menses; hx of ovar cysts in past. Then started period again 17 days later, normal flow; with increased hot flashes/breast tenderness at the time. Period then started again 21 days later 02/24/22, again normal flow.  Had GYN u/s 9/22 for RLQ pain with EM=7 mm, small leio 1.3 cm, and RTO cyst.   Sex activity: single partner, contraception - condoms most of the time and rhythm method. No pain/bleeding. Has some dryness, improved with lubricants.  Last Pap: 10/04/20 Results were: no abnormalities /neg HPV DNA  Hx of STDs: none  Hx of IC. No relief with elmiron. Gets some frequency for a day related to her cycle; sx improved with uribel prn (needs Rx RF). Also does oxybutynin patch.   Last mammogram: 09/16/22  Results were: normal, repeat in 12 months. Hx of cat 4 LT breast 9/22; bx showed PASH  There is no FH of breast cancer. There is no FH of ovarian cancer. The patient does do self-breast exams. There is a FH met bladder cancer in her dad, and non-aggressive prostate cancer in her PGF. Will follow FH for genetic testing.   Tobacco use: The patient denies current or previous tobacco  use. Alcohol use: social drinker No drug use.  Exercise: very active  Colonoscopy: 12/21 at [facility],  repeat due after 10 yrs  She does get adequate calcium and Vitamin D in her diet.  She had elevated lipids 10/18 and was started on lipitor 10 mg daily; stopped it***.  No side effects. Due for labs. Doing well.   Past Medical History:  Diagnosis Date   Arthritis    Heart murmur    Mitral valve prolapse    OCD (obsessive compulsive disorder)    Shingles     Past Surgical History:  Procedure Laterality Date   BREAST BIOPSY Left 09/21/2020   Sterei Bx, ribbon clip, neg   COLONOSCOPY WITH PROPOFOL N/A 12/14/2019   Procedure: COLONOSCOPY WITH PROPOFOL;  Surgeon: Pasty Spillers, MD;  Location: [facility] ENDOSCOPY;  Service: Endoscopy;  Laterality: N/A;   DILATION AND CURETTAGE OF UTERUS  01/06/2002   KNEE ARTHROSCOPY  01/06/1985    Family History  Problem Relation Age of Onset   Prostate cancer Father 60       stage 4   Bladder Cancer Father 70       Malignant   Cancer Sister        Metastatic Cancer   Alzheimer's disease Maternal Grandmother    Diabetes Mellitus II Maternal Grandmother    Heart disease Maternal Grandmother 22   Other Maternal Grandmother        Myocardial infarction old/healed  Diabetes Mellitus II Maternal Grandfather    Prostate cancer Paternal Grandfather 34   Kidney cancer Neg Hx    Breast cancer Neg Hx     Social History   Socioeconomic History   Marital status: Married    Spouse name: Not on file   Number of children: Not on file   Years of education: Not on file   Highest education level: Not on file  Occupational History   Not on file  Tobacco Use   Smoking status: Never   Smokeless tobacco: Never  Vaping Use   Vaping status: Never Used  Substance and Sexual Activity   Alcohol use: No    Alcohol/week: 0.0 standard drinks of alcohol    Comment: a couple drinks a month    Drug use: No   Sexual activity: Yes    Birth  control/protection: Condom  Other Topics Concern   Not on file  Social History Narrative   Not on file   Social Determinants of Health   Financial Resource Strain: Not on file  Food Insecurity: Not on file  Transportation Needs: Not on file  Physical Activity: Not on file  Stress: Not on file  Social Connections: Not on file  Intimate Partner Violence: Not on file    No outpatient medications have been marked as taking for the 10/28/22 encounter (Appointment) with Shakeyla Giebler, Ilona Sorrel, PA-C.     ROS:  Review of Systems  Constitutional:  Negative for fatigue, fever and unexpected weight change.  Respiratory:  Negative for cough, shortness of breath and wheezing.   Cardiovascular:  Negative for chest pain, palpitations and leg swelling.  Gastrointestinal:  Negative for blood in stool, constipation, diarrhea, nausea and vomiting.  Endocrine: Negative for cold intolerance, heat intolerance and polyuria.  Genitourinary:  Positive for frequency. Negative for dyspareunia, dysuria, flank pain, genital sores, hematuria, menstrual problem, pelvic pain, urgency, vaginal bleeding, vaginal discharge and vaginal pain.  Musculoskeletal:  Negative for arthralgias, back pain, joint swelling and myalgias.  Skin:  Negative for rash.  Neurological:  Negative for dizziness, syncope, light-headedness, numbness and headaches.  Hematological:  Negative for adenopathy.  Psychiatric/Behavioral:  Negative for agitation, confusion, sleep disturbance and suicidal ideas. The patient is not nervous/anxious.      Objective: There were no vitals taken for this visit.   Physical Exam Constitutional:      Appearance: She is well-developed.  Genitourinary:     Vulva normal.     Right Labia: No rash, tenderness or lesions.    Left Labia: No tenderness, lesions or rash.    No vaginal discharge, erythema or tenderness.      Right Adnexa: not tender and no mass present.    Left Adnexa: not tender and no  mass present.    No cervical motion tenderness, friability or polyp.     Uterus is not enlarged or tender.  Breasts:    Right: No mass, nipple discharge, skin change or tenderness.     Left: No mass, nipple discharge, skin change or tenderness.  Neck:     Thyroid: No thyromegaly.  Cardiovascular:     Rate and Rhythm: Normal rate and regular rhythm.     Heart sounds: Normal heart sounds. No murmur heard. Pulmonary:     Effort: Pulmonary effort is normal.     Breath sounds: Normal breath sounds.  Abdominal:     Palpations: Abdomen is soft.     Tenderness: There is no abdominal tenderness. There is no guarding or  rebound.  Musculoskeletal:        General: Normal range of motion.     Cervical back: Normal range of motion.  Lymphadenopathy:     Cervical: No cervical adenopathy.  Neurological:     General: No focal deficit present.     Mental Status: She is alert and oriented to person, place, and time.     Cranial Nerves: No cranial nerve deficit.  Skin:    General: Skin is warm and dry.  Psychiatric:        Mood and Affect: Mood normal.        Behavior: Behavior normal.        Thought Content: Thought content normal.        Judgment: Judgment normal.  Vitals reviewed.     Assessment/Plan: Encounter for annual routine gynecological examination  Encounter for screening mammogram for malignant neoplasm of breast; pt current on mammo  IC (interstitial cystitis) - Plan: Meth-Hyo-M Bl-Na Phos-Ph Sal (URIBEL) 118 MG CAPS; Rx RF uribel prn sx  Mittelschmerz  Blood tests for routine general physical examination - Plan: Comprehensive metabolic panel  Mixed hyperlipidemia - Plan: Lipid panel; check labs. Will RF lipitor based on levels   No orders of the defined types were placed in this encounter.   GYN counsel breast self exam, mammography screening, adequate intake of calcium and vitamin D, diet and exercise     F/U  No follow-ups on file.  Fortune Torosian B. Kalayla Shadden,  PA-C 10/27/2022 5:18 PM

## 2022-10-28 ENCOUNTER — Encounter: Payer: Self-pay | Admitting: Obstetrics and Gynecology

## 2022-10-28 ENCOUNTER — Ambulatory Visit: Payer: BC Managed Care – PPO | Admitting: Obstetrics and Gynecology

## 2022-10-28 VITALS — BP 118/72 | Ht 63.0 in | Wt 127.0 lb

## 2022-10-28 DIAGNOSIS — E782 Mixed hyperlipidemia: Secondary | ICD-10-CM

## 2022-10-28 DIAGNOSIS — Z0000 Encounter for general adult medical examination without abnormal findings: Secondary | ICD-10-CM | POA: Diagnosis not present

## 2022-10-28 DIAGNOSIS — Z13220 Encounter for screening for lipoid disorders: Secondary | ICD-10-CM | POA: Diagnosis not present

## 2022-10-28 DIAGNOSIS — Z1231 Encounter for screening mammogram for malignant neoplasm of breast: Secondary | ICD-10-CM

## 2022-10-28 DIAGNOSIS — N3010 Interstitial cystitis (chronic) without hematuria: Secondary | ICD-10-CM

## 2022-10-28 DIAGNOSIS — Z01419 Encounter for gynecological examination (general) (routine) without abnormal findings: Secondary | ICD-10-CM

## 2022-10-28 DIAGNOSIS — R7301 Impaired fasting glucose: Secondary | ICD-10-CM

## 2022-10-28 MED ORDER — URIBEL 118 MG PO CAPS: 118.0000 mg | ORAL_CAPSULE | Freq: Four times a day (QID) | ORAL | 1 refills | Status: DC | PRN

## 2022-10-28 NOTE — Patient Instructions (Signed)
I value your feedback and you entrusting us with your care. If you get a [facility] patient survey, I would appreciate you taking the time to let us know about your experience today. Thank you! ? ? ?

## 2022-10-29 LAB — COMPREHENSIVE METABOLIC PANEL
ALT: 13 [IU]/L (ref 0–32)
AST: 16 [IU]/L (ref 0–40)
Albumin: 4.3 g/dL (ref 3.8–4.9)
Alkaline Phosphatase: 72 [IU]/L (ref 44–121)
BUN/Creatinine Ratio: 27 — ABNORMAL HIGH (ref 9–23)
BUN: 15 mg/dL (ref 6–24)
Bilirubin Total: 0.3 mg/dL (ref 0.0–1.2)
CO2: 22 mmol/L (ref 20–29)
Calcium: 9.7 mg/dL (ref 8.7–10.2)
Chloride: 98 mmol/L (ref 96–106)
Creatinine, Ser: 0.55 mg/dL — ABNORMAL LOW (ref 0.57–1.00)
Globulin, Total: 2.9 g/dL (ref 1.5–4.5)
Glucose: 114 mg/dL — ABNORMAL HIGH (ref 70–99)
Potassium: 4.4 mmol/L (ref 3.5–5.2)
Sodium: 137 mmol/L (ref 134–144)
Total Protein: 7.2 g/dL (ref 6.0–8.5)
eGFR: 110 mL/min/{1.73_m2} (ref >59)

## 2022-10-29 LAB — CBC WITH DIFFERENTIAL/PLATELET
Basophils Absolute: 0 10*3/uL (ref 0.0–0.2)
Basos: 0 %
EOS (ABSOLUTE): 0.1 10*3/uL (ref 0.0–0.4)
Eos: 1 %
Hematocrit: 40 % (ref 34.0–46.6)
Hemoglobin: 12.8 g/dL (ref 11.1–15.9)
Immature Grans (Abs): 0 10*3/uL (ref 0.0–0.1)
Immature Granulocytes: 0 %
Lymphocytes Absolute: 2.3 10*3/uL (ref 0.7–3.1)
Lymphs: 29 %
MCH: 29.5 pg (ref 26.6–33.0)
MCHC: 32 g/dL (ref 31.5–35.7)
MCV: 92 fL (ref 79–97)
Monocytes Absolute: 0.5 10*3/uL (ref 0.1–0.9)
Monocytes: 6 %
Neutrophils Absolute: 4.9 10*3/uL (ref 1.4–7.0)
Neutrophils: 64 %
Platelets: 314 10*3/uL (ref 150–450)
RBC: 4.34 x10E6/uL (ref 3.77–5.28)
RDW: 13 % (ref 11.7–15.4)
WBC: 7.8 10*3/uL (ref 3.4–10.8)

## 2022-10-29 LAB — LIPID PANEL
Chol/HDL Ratio: 3.8 ratio (ref 0.0–4.4)
Cholesterol, Total: 231 mg/dL — ABNORMAL HIGH (ref 100–199)
HDL: 61 mg/dL (ref >39)
LDL Chol Calc (NIH): 141 mg/dL — ABNORMAL HIGH (ref 0–99)
Triglycerides: 163 mg/dL — ABNORMAL HIGH (ref 0–149)
VLDL Cholesterol Cal: 29 mg/dL (ref 5–40)

## 2022-10-30 NOTE — Addendum Note (Signed)
Addended by: Althea Grimmer B on: 10/30/2022 12:15 PM   Modules accepted: Orders

## 2023-08-03 ENCOUNTER — Other Ambulatory Visit: Payer: Self-pay | Admitting: Obstetrics and Gynecology

## 2023-08-03 DIAGNOSIS — Z1231 Encounter for screening mammogram for malignant neoplasm of breast: Secondary | ICD-10-CM

## 2023-09-17 ENCOUNTER — Ambulatory Visit
Admission: RE | Admit: 2023-09-17 | Discharge: 2023-09-17 | Disposition: A | Discharge status: Home / Self Care | Source: Ambulatory Visit | Attending: Obstetrics and Gynecology | Admitting: Obstetrics and Gynecology

## 2023-09-17 DIAGNOSIS — Z1231 Encounter for screening mammogram for malignant neoplasm of breast: Secondary | ICD-10-CM | POA: Insufficient documentation

## 2023-09-21 ENCOUNTER — Ambulatory Visit: Payer: Self-pay | Admitting: Obstetrics and Gynecology

## 2023-09-21 ENCOUNTER — Other Ambulatory Visit: Payer: Self-pay | Admitting: Obstetrics and Gynecology

## 2023-09-21 DIAGNOSIS — R928 Other abnormal and inconclusive findings on diagnostic imaging of breast: Secondary | ICD-10-CM

## 2023-09-24 ENCOUNTER — Ambulatory Visit: Payer: Self-pay | Admitting: Obstetrics and Gynecology

## 2023-09-24 ENCOUNTER — Ambulatory Visit
Admission: RE | Admit: 2023-09-24 | Discharge: 2023-09-24 | Disposition: A | Discharge status: Home / Self Care | Source: Ambulatory Visit | Attending: Obstetrics and Gynecology | Admitting: Obstetrics and Gynecology

## 2023-09-24 DIAGNOSIS — R92333 Mammographic heterogeneous density, bilateral breasts: Secondary | ICD-10-CM | POA: Diagnosis not present

## 2023-09-24 DIAGNOSIS — R928 Other abnormal and inconclusive findings on diagnostic imaging of breast: Secondary | ICD-10-CM | POA: Diagnosis not present

## 2023-09-24 DIAGNOSIS — R920 Mammographic microcalcification found on diagnostic imaging of breast: Secondary | ICD-10-CM | POA: Diagnosis not present

## 2023-10-28 NOTE — Progress Notes (Unsigned)
 PCP:  Watt Bernarda NOVAK, PA-C   No chief complaint on file.    HPI:      [name] is a 53 y.o. [id number] who LMP was No LMP recorded., presents today for her annual examination.  Her menses are irregular now due to perimenopause. Sometimes has menses 2 1/2 wks to 2 months, lasting 3-4 days, normal flow, mild dysmen, no BTB. Does have vasomotor sx.  8/24 GYN u/s with EM=3 mm; Anterior intramural fibroid measures 2.9 cm. Posterior subserosal fibroid measures 1.3 cm. Additional intramural fibroids measure approximately 1 cm. Hx of RTO cyst 9/22 on u/s.   Sex activity: single partner, contraception - condoms most of the time and rhythm method. No pain/bleeding. Has some dryness, improved with lubricants.  Last Pap: 10/04/20 Results were: no abnormalities /neg HPV DNA  Hx of STDs: none  Hx of IC. No relief with elmiron . Gets some frequency for a day related to her cycle/stress; sx improved with uribel  prn (needs Rx RF).   Last mammogram: 09/24/23  Results were: cat 3 LT breast, repeat mammo due in 6months. Hx of cat 4 LT breast 9/22; bx showed PASH  There is no FH of breast cancer. There is no FH of ovarian cancer. The patient does do self-breast exams. There is a FH met bladder cancer in her dad, and non-aggressive prostate cancer in her PGF. Will follow FH for genetic testing.   Tobacco use: The patient denies current or previous tobacco use. Alcohol use: social drinker No drug use.  Exercise: min active due to recent life events  Colonoscopy: 12/21 at [facility],  repeat due after 10 yrs  She does get adequate calcium  and Vitamin D in her diet.  She had elevated lipids 10/18 and was started on lipitor 10 mg daily; took for many yrs; stopped it 2/24 due to change in lipid mgmt guidelines. Pt due for repeat labs since off meds.    Past Medical History:  Diagnosis Date   Arthritis    Heart murmur    Mitral valve prolapse    OCD (obsessive compulsive disorder)     Shingles     Past Surgical History:  Procedure Laterality Date   BREAST BIOPSY Left 09/21/2020   Sterei Bx, ribbon clip, neg   COLONOSCOPY WITH PROPOFOL  N/A 12/14/2019   Procedure: COLONOSCOPY WITH PROPOFOL ;  Surgeon: Janalyn Keene NOVAK, MD;  Location: [facility] ENDOSCOPY;  Service: Endoscopy;  Laterality: N/A;   DILATION AND CURETTAGE OF UTERUS  01/06/2002   KNEE ARTHROSCOPY  01/06/1985    Family History  Problem Relation Age of Onset   Bladder Cancer Father 24       Malignant   Cancer Sister        Metastatic Cancer--most likely lung   Alzheimer's disease Maternal Grandmother    Diabetes Mellitus II Maternal Grandmother    Heart disease Maternal Grandmother 86   Other Maternal Grandmother        Myocardial infarction old/healed   Diabetes Mellitus II Maternal Grandfather    Prostate cancer Paternal Grandfather 57   Kidney cancer Neg Hx    Breast cancer Neg Hx     Social History   Socioeconomic History   Marital status: Married    Spouse name: Not on file   Number of children: Not on file   Years of education: Not on file   Highest education level: Not on file  Occupational History   Not on file  Tobacco Use  Smoking status: Never   Smokeless tobacco: Never  Vaping Use   Vaping status: Never Used  Substance and Sexual Activity   Alcohol use: Yes    Comment: a couple drinks a month    Drug use: No   Sexual activity: Yes    Birth control/protection: Condom  Other Topics Concern   Not on file  Social History Narrative   Not on file   Social Drivers of Health   Financial Resource Strain: Not on file  Food Insecurity: Not on file  Transportation Needs: Not on file  Physical Activity: Not on file  Stress: Not on file  Social Connections: Not on file  Intimate Partner Violence: Not on file    No outpatient medications have been marked as taking for the 10/29/23 encounter (Appointment) with Holger Sokolowski B, PA-C.     ROS:  Review of Systems   Constitutional:  Negative for fatigue, fever and unexpected weight change.  Respiratory:  Negative for cough, shortness of breath and wheezing.   Cardiovascular:  Negative for chest pain, palpitations and leg swelling.  Gastrointestinal:  Negative for blood in stool, constipation, diarrhea, nausea and vomiting.  Endocrine: Negative for cold intolerance, heat intolerance and polyuria.  Genitourinary:  Negative for dyspareunia, dysuria, flank pain, frequency, genital sores, hematuria, menstrual problem, pelvic pain, urgency, vaginal bleeding, vaginal discharge and vaginal pain.  Musculoskeletal:  Negative for arthralgias, back pain, joint swelling and myalgias.  Skin:  Negative for rash.  Neurological:  Negative for dizziness, syncope, light-headedness, numbness and headaches.  Hematological:  Negative for adenopathy.  Psychiatric/Behavioral:  Negative for agitation, confusion, sleep disturbance and suicidal ideas. The patient is not nervous/anxious.      Objective: There were no vitals taken for this visit.   Physical Exam Constitutional:      Appearance: She is well-developed.  Genitourinary:     Vulva normal.     Right Labia: No rash, tenderness or lesions.    Left Labia: No tenderness, lesions or rash.    No vaginal discharge, erythema or tenderness.      Right Adnexa: not tender and no mass present.    Left Adnexa: not tender and no mass present.    No cervical motion tenderness, friability or polyp.     Uterus is not enlarged or tender.  Breasts:    Right: No mass, nipple discharge, skin change or tenderness.     Left: No mass, nipple discharge, skin change or tenderness.  Neck:     Thyroid : No thyromegaly.  Cardiovascular:     Rate and Rhythm: Normal rate and regular rhythm.     Heart sounds: Normal heart sounds. No murmur heard. Pulmonary:     Effort: Pulmonary effort is normal.     Breath sounds: Normal breath sounds.  Abdominal:     Palpations: Abdomen is soft.      Tenderness: There is no abdominal tenderness. There is no guarding or rebound.  Musculoskeletal:        General: Normal range of motion.     Cervical back: Normal range of motion.  Lymphadenopathy:     Cervical: No cervical adenopathy.  Neurological:     General: No focal deficit present.     Mental Status: She is alert and oriented to person, place, and time.     Cranial Nerves: No cranial nerve deficit.  Skin:    General: Skin is warm and dry.  Psychiatric:        Mood and Affect: Mood normal.  Behavior: Behavior normal.        Thought Content: Thought content normal.        Judgment: Judgment normal.  Vitals reviewed.     Assessment/Plan: Encounter for annual routine gynecological examination  Encounter for screening mammogram for malignant neoplasm of breast; pt current on mammo  IC (interstitial cystitis) - Plan: Meth-Hyo-M Bl-Na Phos-Ph Sal (URIBEL ) 118 MG CAPS; Rx RF. Takes occas  Blood tests for routine general physical examination - Plan: Lipid panel, Comprehensive metabolic panel, CBC with Differential/Platelet  Screening cholesterol level - Plan: Lipid panel; recheck labs off lipitor now.   No orders of the defined types were placed in this encounter.   GYN counsel breast self exam, mammography screening, adequate intake of calcium  and vitamin D, diet and exercise     F/U  No follow-ups on file.  Tamberlyn Midgley B. Arslan Kier, PA-C 10/28/2023 12:52 PM

## 2023-10-29 ENCOUNTER — Encounter: Payer: Self-pay | Admitting: Obstetrics and Gynecology

## 2023-10-29 ENCOUNTER — Other Ambulatory Visit (HOSPITAL_COMMUNITY)
Admission: RE | Admit: 2023-10-29 | Discharge: 2023-10-29 | Disposition: A | Discharge status: Home / Self Care | Source: Ambulatory Visit | Attending: Obstetrics and Gynecology | Admitting: Obstetrics and Gynecology

## 2023-10-29 ENCOUNTER — Ambulatory Visit (INDEPENDENT_AMBULATORY_CARE_PROVIDER_SITE_OTHER): Admitting: Obstetrics and Gynecology

## 2023-10-29 VITALS — BP 123/65 | HR 74 | Ht 63.0 in | Wt 131.0 lb

## 2023-10-29 DIAGNOSIS — Z01411 Encounter for gynecological examination (general) (routine) with abnormal findings: Secondary | ICD-10-CM | POA: Diagnosis not present

## 2023-10-29 DIAGNOSIS — Z1151 Encounter for screening for human papillomavirus (HPV): Secondary | ICD-10-CM | POA: Diagnosis not present

## 2023-10-29 DIAGNOSIS — N3010 Interstitial cystitis (chronic) without hematuria: Secondary | ICD-10-CM

## 2023-10-29 DIAGNOSIS — R928 Other abnormal and inconclusive findings on diagnostic imaging of breast: Secondary | ICD-10-CM

## 2023-10-29 DIAGNOSIS — D259 Leiomyoma of uterus, unspecified: Secondary | ICD-10-CM | POA: Diagnosis not present

## 2023-10-29 DIAGNOSIS — N939 Abnormal uterine and vaginal bleeding, unspecified: Secondary | ICD-10-CM

## 2023-10-29 DIAGNOSIS — Z124 Encounter for screening for malignant neoplasm of cervix: Secondary | ICD-10-CM | POA: Insufficient documentation

## 2023-10-29 DIAGNOSIS — Z131 Encounter for screening for diabetes mellitus: Secondary | ICD-10-CM | POA: Diagnosis not present

## 2023-10-29 DIAGNOSIS — Z13220 Encounter for screening for lipoid disorders: Secondary | ICD-10-CM | POA: Diagnosis not present

## 2023-10-29 DIAGNOSIS — N951 Menopausal and female climacteric states: Secondary | ICD-10-CM

## 2023-10-29 DIAGNOSIS — Z1231 Encounter for screening mammogram for malignant neoplasm of breast: Secondary | ICD-10-CM

## 2023-10-29 DIAGNOSIS — Z0000 Encounter for general adult medical examination without abnormal findings: Secondary | ICD-10-CM

## 2023-10-29 DIAGNOSIS — D219 Benign neoplasm of connective and other soft tissue, unspecified: Secondary | ICD-10-CM | POA: Insufficient documentation

## 2023-10-29 DIAGNOSIS — Z01419 Encounter for gynecological examination (general) (routine) without abnormal findings: Secondary | ICD-10-CM

## 2023-10-29 MED ORDER — URIBEL 118 MG PO CAPS: 118.0000 mg | ORAL_CAPSULE | Freq: Four times a day (QID) | ORAL | 3 refills | Status: AC | PRN

## 2023-10-29 NOTE — Patient Instructions (Addendum)
 I value your feedback and you entrusting [place] with your care. If you get a [place] patient survey, I would appreciate you taking the time to let us know about your experience today. Thank you! ? ? ?

## 2023-10-30 LAB — CBC WITH DIFFERENTIAL/PLATELET
Basophils Absolute: 0 x10E3/uL (ref 0.0–0.2)
Basos: 0 %
EOS (ABSOLUTE): 0.1 x10E3/uL (ref 0.0–0.4)
Eos: 1 %
Hematocrit: 40 % (ref 34.0–46.6)
Hemoglobin: 13.1 g/dL (ref 11.1–15.9)
Immature Grans (Abs): 0 x10E3/uL (ref 0.0–0.1)
Immature Granulocytes: 0 %
Lymphocytes Absolute: 2.3 x10E3/uL (ref 0.7–3.1)
Lymphs: 43 %
MCH: 30.2 pg (ref 26.6–33.0)
MCHC: 32.8 g/dL (ref 31.5–35.7)
MCV: 92 fL (ref 79–97)
Monocytes Absolute: 0.5 x10E3/uL (ref 0.1–0.9)
Monocytes: 9 %
Neutrophils Absolute: 2.5 x10E3/uL (ref 1.4–7.0)
Neutrophils: 47 %
Platelets: 267 x10E3/uL (ref 150–450)
RBC: 4.34 x10E6/uL (ref 3.77–5.28)
RDW: 12.1 % (ref 11.7–15.4)
WBC: 5.4 x10E3/uL (ref 3.4–10.8)

## 2023-10-30 LAB — COMPREHENSIVE METABOLIC PANEL WITH GFR
ALT: 11 IU/L (ref 0–32)
AST: 18 IU/L (ref 0–40)
Albumin: 4.5 g/dL (ref 3.8–4.9)
Alkaline Phosphatase: 77 IU/L (ref 49–135)
BUN/Creatinine Ratio: 28 — ABNORMAL HIGH (ref 9–23)
BUN: 19 mg/dL (ref 6–24)
Bilirubin Total: 0.3 mg/dL (ref 0.0–1.2)
CO2: 23 mmol/L (ref 20–29)
Calcium: 9.6 mg/dL (ref 8.7–10.2)
Chloride: 101 mmol/L (ref 96–106)
Creatinine, Ser: 0.68 mg/dL (ref 0.57–1.00)
Globulin, Total: 2.5 g/dL (ref 1.5–4.5)
Glucose: 94 mg/dL (ref 70–99)
Potassium: 4.5 mmol/L (ref 3.5–5.2)
Sodium: 140 mmol/L (ref 134–144)
Total Protein: 7 g/dL (ref 6.0–8.5)
eGFR: 103 mL/min/1.73 (ref >59)

## 2023-10-30 LAB — LIPID PANEL
Chol/HDL Ratio: 4 ratio (ref 0.0–4.4)
Cholesterol, Total: 265 mg/dL — ABNORMAL HIGH (ref 100–199)
HDL: 67 mg/dL (ref >39)
LDL Chol Calc (NIH): 184 mg/dL — ABNORMAL HIGH (ref 0–99)
Triglycerides: 84 mg/dL (ref 0–149)
VLDL Cholesterol Cal: 14 mg/dL (ref 5–40)

## 2023-10-30 LAB — HEMOGLOBIN A1C
Est. average glucose Bld gHb Est-mCnc: 120 mg/dL
Hgb A1c MFr Bld: 5.8 % — ABNORMAL HIGH (ref 4.8–5.6)

## 2023-11-02 ENCOUNTER — Ambulatory Visit: Payer: Self-pay | Admitting: Obstetrics and Gynecology

## 2023-11-02 DIAGNOSIS — E7800 Pure hypercholesterolemia, unspecified: Secondary | ICD-10-CM

## 2023-11-02 DIAGNOSIS — Z0000 Encounter for general adult medical examination without abnormal findings: Secondary | ICD-10-CM

## 2023-11-02 DIAGNOSIS — R7303 Prediabetes: Secondary | ICD-10-CM

## 2023-11-02 LAB — CYTOLOGY - PAP
Adequacy: ABSENT
Comment: NEGATIVE
Diagnosis: NEGATIVE
High risk HPV: NEGATIVE

## 2023-11-02 NOTE — Addendum Note (Signed)
 Addended by: WATT HILA B on: 11/02/2023 03:34 PM   Modules accepted: Orders

## 2023-12-24 ENCOUNTER — Other Ambulatory Visit: Payer: Self-pay | Admitting: Obstetrics and Gynecology

## 2023-12-24 DIAGNOSIS — Z1231 Encounter for screening mammogram for malignant neoplasm of breast: Secondary | ICD-10-CM

## 2023-12-24 DIAGNOSIS — R928 Other abnormal and inconclusive findings on diagnostic imaging of breast: Secondary | ICD-10-CM

## 2024-02-12 ENCOUNTER — Ambulatory Visit: Admitting: Internal Medicine

## 2024-02-12 ENCOUNTER — Encounter: Payer: Self-pay | Admitting: Internal Medicine

## 2024-02-12 ENCOUNTER — Other Ambulatory Visit: Payer: Self-pay

## 2024-02-12 VITALS — BP 110/72 | HR 73 | Temp 97.9°F | Resp 16 | Ht 63.0 in | Wt 129.6 lb

## 2024-02-12 DIAGNOSIS — N3010 Interstitial cystitis (chronic) without hematuria: Secondary | ICD-10-CM

## 2024-02-12 DIAGNOSIS — K219 Gastro-esophageal reflux disease without esophagitis: Secondary | ICD-10-CM

## 2024-02-12 DIAGNOSIS — R7303 Prediabetes: Secondary | ICD-10-CM

## 2024-02-12 DIAGNOSIS — Z13220 Encounter for screening for lipoid disorders: Secondary | ICD-10-CM

## 2024-02-12 NOTE — Progress Notes (Signed)
 "  New Patient Office Visit  Subjective    Patient ID: Allison Lambert, female    DOB: Dec 15, 1970  Age: 54 y.o. MRN: [id number]  CC:  Chief Complaint  Patient presents with   Establish Care    HPI Allison Lambert presents to establish care.  Discussed the use of AI scribe software for clinical note transcription with the patient, who gave verbal consent to proceed.  History of Present Illness Allison Lambert is a 54 year old female who presents to establish care and address concerns about high cholesterol and blood sugar levels.  She has interstitial cystitis with occasional painful flares. She has tried Myrbetriq  and oxybutynin patches but use is limited by insurance and side effects. She uses Miravec intermittently for symptom relief.  She has high cholesterol previously treated with lifestyle changes and statins. Her most recent lipid panel was the first annual labs after being off atorvastatin  for a year. She has followed regular exercise and dietary changes since November.  She has a strong family history of type 2 diabetes and had gestational diabetes with her first pregnancy. Her most recent A1c was 5.8 in the prediabetic range. She is working on higher fiber intake and regular exercise for blood sugar control.  She is transitioning from perimenopause to menopause, with her last full period in July and spotting in August. She has intermittent hot flashes. Prior sleep disturbance has improved. She has intermittent right-sided abdominal discomfort with known small fibroids and a prior resolving cyst, and she notes occasional constipation and mild heartburn after certain foods or drinks.  She had shingles about three to four years ago, which resolved with antivirals. She has received one shingles vaccine and plans to complete the series.   HLD: -Medications: nothing, had been on a statin previously but nothing right now -Patient is compliant with above medications and reports  no side effects.  -Last lipid panel: Lipid Panel     Component Value Date/Time   CHOL 265 (H) 10/29/2023 0905   TRIG 84 10/29/2023 0905   HDL 67 10/29/2023 0905   CHOLHDL 4.0 10/29/2023 0905   LDLCALC 184 (H) 10/29/2023 0905   LABVLDL 14 10/29/2023 0905   The 10-year ASCVD risk score (Arnett DK, et al., 2019) is: 1.5%   Values used to calculate the score:     Age: 9 years     Clinically relevant sex: Female     Is Non-Hispanic African American: No     Diabetic: No     Tobacco smoker: No     Systolic Blood Pressure: 110 mmHg     Is BP treated: No     HDL Cholesterol: 67 mg/dL     Total Cholesterol: 265 mg/dL  Pre-Diabetes:  -Last J8r 10/25 5.8% -Not on medications  -History of gestational diabetes   Health Maintenance: -Blood work UTD -Mammogram scheduled in March  -Colonoscopy 12/21, repeat in 10 year -Pap 10/25 negative   Outpatient Encounter Medications as of 02/12/2024  Medication Sig   Calcium -Magnesium-Vitamin D (CALCIUM  1200+D3 PO)    Meth-Hyo-M Bl-Na Phos-Ph Sal (URIBEL ) 118 MG CAPS Take 1 capsule (118 mg total) by mouth 4 (four) times daily as needed.   omega-3 acid ethyl esters (LOVAZA) 1 G capsule Take by mouth 2 (two) times daily.   Vitamin D, Cholecalciferol, 1000 UNITS CAPS Take by mouth.   No facility-administered encounter medications on file as of 02/12/2024.    Past Medical History:  Diagnosis Date   Arthritis  Heart murmur    Interstitial cystitis    Mitral valve prolapse    OCD (obsessive compulsive disorder)    OCD (obsessive compulsive disorder)    Shingles     Past Surgical History:  Procedure Laterality Date   BREAST BIOPSY Left 09/21/2020   Sterei Bx, ribbon clip, neg   COLONOSCOPY WITH PROPOFOL  N/A 12/14/2019   Procedure: COLONOSCOPY WITH PROPOFOL ;  Surgeon: Janalyn Keene NOVAK, MD;  Location: [facility] ENDOSCOPY;  Service: Endoscopy;  Laterality: N/A;   DILATION AND CURETTAGE OF UTERUS  01/06/2002   KNEE ARTHROSCOPY  01/06/1985     Family History  Problem Relation Age of Onset   Bladder Cancer Father 50       Malignant   Cancer Sister        Metastatic Cancer--most likely lung   Alzheimer's disease Maternal Grandmother    Diabetes Mellitus II Maternal Grandmother    Heart disease Maternal Grandmother 14   Other Maternal Grandmother        Myocardial infarction old/healed   Diabetes Mellitus II Maternal Grandfather    Prostate cancer Paternal Grandfather 48   Kidney cancer Neg Hx    Breast cancer Neg Hx     Social History   Socioeconomic History   Marital status: Married    Spouse name: Not on file   Number of children: Not on file   Years of education: Not on file   Highest education level: Master's degree (e.g., MA, MS, MEng, MEd, MSW, MBA)  Occupational History   Not on file  Tobacco Use   Smoking status: Never   Smokeless tobacco: Never  Vaping Use   Vaping status: Never Used  Substance and Sexual Activity   Alcohol use: Yes    Comment: a couple drinks a month    Drug use: No   Sexual activity: Yes    Birth control/protection: Condom  Other Topics Concern   Not on file  Social History Narrative   Not on file   Social Drivers of Health   Tobacco Use: Low Risk (02/12/2024)   Patient History    Smoking Tobacco Use: Never    Smokeless Tobacco Use: Never    Passive Exposure: Not on file  Financial Resource Strain: Low Risk (02/06/2024)   Overall Financial Resource Strain (CARDIA)    Difficulty of Paying Living Expenses: Not hard at all  Food Insecurity: No Food Insecurity (02/06/2024)   Epic    Worried About Radiation Protection Practitioner of Food in the Last Year: Never true    Ran Out of Food in the Last Year: Never true  Transportation Needs: No Transportation Needs (02/06/2024)   Epic    Lack of Transportation (Medical): No    Lack of Transportation (Non-Medical): No  Physical Activity: Sufficiently Active (02/06/2024)   Exercise Vital Sign    Days of Exercise per Week: 5 days    Minutes of  Exercise per Session: 30 min  Stress: Stress Concern Present (02/06/2024)   [employer] of Occupational Health - Occupational Stress Questionnaire    Feeling of Stress: To some extent  Social Connections: Socially Integrated (02/06/2024)   Social Connection and Isolation Panel    Frequency of Communication with Friends and Family: More than three times a week    Frequency of Social Gatherings with Friends and Family: Once a week    Attends Religious Services: More than 4 times per year    Active Member of [employer] or Organizations: Yes    Attends Banker  Meetings: More than 4 times per year    Marital Status: Married  Catering Manager Violence: Not on file  Depression (PHQ2-9): Low Risk (02/12/2024)   Depression (PHQ2-9)    PHQ-2 Score: 0  Alcohol Screen: Low Risk (02/06/2024)   Alcohol Screen    Last Alcohol Screening Score (AUDIT): 2  Housing: Low Risk (02/06/2024)   Epic    Unable to Pay for Housing in the Last Year: No    Number of Times Moved in the Last Year: 0    Homeless in the Last Year: No  Utilities: Not on file  Health Literacy: Not on file    Review of Systems  Constitutional:  Negative for chills and fever.  Gastrointestinal:  Positive for abdominal pain, constipation and heartburn. Negative for nausea and vomiting.        Objective    BP 110/72 (Cuff Size: Large)   Pulse 73   Temp 97.9 F (36.6 C) (Oral)   Resp 16   Ht 5' 3 (1.6 m)   Wt 129 lb 9.6 oz (58.8 kg)   LMP 07/14/2023   SpO2 97%   BMI 22.96 kg/m   Physical Exam Constitutional:      Appearance: Normal appearance.  HENT:     Head: Normocephalic and atraumatic.     Mouth/Throat:     Mouth: Mucous membranes are moist.     Pharynx: Oropharynx is clear.  Eyes:     Extraocular Movements: Extraocular movements intact.     Conjunctiva/sclera: Conjunctivae normal.     Pupils: Pupils are equal, round, and reactive to light.  Cardiovascular:     Rate and Rhythm: Normal rate and  regular rhythm.  Pulmonary:     Effort: Pulmonary effort is normal.     Breath sounds: Normal breath sounds.  Skin:    General: Skin is warm and dry.  Neurological:     General: No focal deficit present.     Mental Status: She is alert. Mental status is at baseline.  Psychiatric:        Mood and Affect: Mood normal.        Behavior: Behavior normal.         Assessment & Plan:   Assessment & Plan Woman's Wellness Visit Routine wellness visit. Recent mammogram showed microcalcifications; follow-up scheduled. Vaccinations up to date except for second shingles and pneumonia vaccines. - Discussed benefits of pneumococcal vaccination: lowers risk of severe pneumonia, hospitalization, and death; recommended one lifetime dose. - Will administer second shingles vaccine. - Will discuss pneumonia vaccine at next visit. - Follow up in about 6 months.  Prediabetes A1c is 5.8%, indicating prediabetes. Family history of type 2 diabetes and gestational diabetes. Lifestyle modifications include increased exercise and dietary changes. - Ordered A1c test for April. - Continue lifestyle modifications including exercise and dietary changes.  Hyperlipidemia LDL cholesterol increased from 101 to 184 after discontinuation of atorvastatin , suggesting a genetic component. Current lifestyle includes regular exercise and dietary modifications. Cardiovascular risk is low at 1.5%. - Ordered lipid panel for April. - Continue lifestyle modifications.  Menopausal transition Transitioning from perimenopause to menopause with irregular periods and intermittent hot flashes. No current medication due to recent breast biopsy and cholesterol management. - Continue to monitor symptoms and will reassess if needed.  Overactive bladder and interstitial cystitis Occasional flare-ups. Previous treatments include Myrbetriq  and oxybutynin patches, discontinued due to insurance and efficacy concerns. Mirabegron  used as  needed for severe symptoms. - Continue Mirabegron  as needed for severe  symptoms.  Gastroesophageal reflux symptoms Intermittent right upper quadrant discomfort, possibly related to gallbladder or acid reflux. Symptoms include mild heartburn after heavy meals and carbonated drinks. Differential includes biliary colic and acid reflux. - Discussed imaging: right upper quadrant ultrasound is faster, lower cost, no radiation; Prilosec available over the counter at low doses. - Avoid known dietary triggers. - Consider over-the-counter antacids and acid reducers as needed. - Will consider right upper quadrant ultrasound if symptoms persist or worsen.  - Lipid Profile; Future - HgB A1c; Future   Return in about 6 months (around 08/11/2024).   Sharyle Fischer, DO  "

## 2024-02-12 NOTE — Patient Instructions (Addendum)
 It was great seeing you today!  Plan discussed at today's visit: -Blood work ordered today, results will be uploaded to MyChart. Please return any weekday 8-11:30 or 1:30-3:30.   Follow up in: 6 months  Take care and let us  know if you have any questions or concerns prior to your next visit.  Dr. Bernardo   Pneumococcal Conjugate Vaccine (PCV20) Injection What is this medication? PNEUMOCOCCAL CONJUGATE VACCINE (NEU mo KOK al kon ju gate vak SEEN) reduces the risk of pneumococcal disease, such as pneumonia. It does not treat pneumococcal disease. It is still possible to get pneumococcal disease after receiving this vaccine, but the symptoms may be less severe or not last as long. It works by helping your immune system learn how to fight off a future infection. This medicine may be used for other purposes; ask your health care provider or pharmacist if you have questions. COMMON BRAND NAME(S): Prevnar 20 What should I tell my care team before I take this medication? They need to know if you have any of these conditions: Bleeding disorder Fever Immune system problems An unusual or allergic reaction to pneumococcal vaccine, diphtheria toxoid, other vaccines, other medications, foods, dyes, or preservatives Pregnant or trying to get pregnant Breastfeeding How should I use this medication? This vaccine is injected into a muscle. It is given by your care team. A copy of Vaccine Information Statements will be given before each vaccination. Be sure to read this information carefully each time. This sheet may change often. Talk to your care team about the use of this medication in children. While it may be given to children as young as 6 weeks for selected conditions, precautions do apply. Overdosage: If you think you have taken too much of this medicine contact a poison control center or emergency room at once. NOTE: This medicine is only for you. Do not share this medicine with others. What if  I miss a dose? This does not apply. This medication is not for regular use. What may interact with this medication? Medications for cancer chemotherapy Medications that suppress your immune function Steroid medications, such as prednisone or cortisone This list may not describe all possible interactions. Give your health care provider a list of all the medicines, herbs, non-prescription drugs, or dietary supplements you use. Also tell them if you smoke, drink alcohol, or use illegal drugs. Some items may interact with your medicine. What should I watch for while using this medication? Visit your care team regularly. Report any side effects to your care team right away. This vaccine, like all vaccines, may not fully protect everyone. What side effects may I notice from receiving this medication? Side effects that you should report to your care team as soon as possible: Allergic reactions--skin rash, itching, hives, swelling of the face, lips, tongue, or throat Side effects that usually do not require medical attention (report these to your care team if they continue or are bothersome): Fatigue Fever Headache Joint pain Muscle pain Pain, redness, or irritation at injection site This list may not describe all possible side effects. Call your doctor for medical advice about side effects. You may report side effects to FDA at 1-800-FDA-1088. Where should I keep my medication? This vaccine is only given by your care team. It will not be stored at home. NOTE: This sheet is a summary. It may not cover all possible information. If you have questions about this medicine, talk to your doctor, pharmacist, or health care provider.  2024 Elsevier/Gold  Standard (2021-06-05 00:00:00)

## 2024-03-30 ENCOUNTER — Encounter

## 2024-03-30 ENCOUNTER — Other Ambulatory Visit

## 2024-08-11 ENCOUNTER — Ambulatory Visit: Admitting: Internal Medicine
